# Patient Record
Sex: Female | Born: 1970 | Race: Black or African American | Hispanic: No | Marital: Married | Smoking: Never smoker
Health system: Southern US, Community
[De-identification: ages and names within clinical notes are randomized; demographics above are authoritative.]

## PROBLEM LIST (undated history)

## (undated) DIAGNOSIS — I10 Essential (primary) hypertension: Secondary | ICD-10-CM

## (undated) DIAGNOSIS — E119 Type 2 diabetes mellitus without complications: Secondary | ICD-10-CM

## (undated) DIAGNOSIS — G4730 Sleep apnea, unspecified: Secondary | ICD-10-CM

## (undated) DIAGNOSIS — C801 Malignant (primary) neoplasm, unspecified: Secondary | ICD-10-CM

## (undated) DIAGNOSIS — D649 Anemia, unspecified: Secondary | ICD-10-CM

---

## 1989-10-02 HISTORY — PX: OTHER SURGICAL HISTORY: SHX169

## 1991-10-03 HISTORY — PX: TUBAL LIGATION: SHX77

## 1992-10-02 HISTORY — PX: OTHER SURGICAL HISTORY: SHX169

## 2007-06-27 ENCOUNTER — Ambulatory Visit: Payer: Self-pay | Admitting: Cardiology

## 2009-09-02 ENCOUNTER — Ambulatory Visit (HOSPITAL_COMMUNITY): Admission: RE | Admit: 2009-09-02 | Discharge: 2009-09-02 | Payer: Self-pay | Admitting: Family Medicine

## 2009-10-02 HISTORY — PX: BREAST SURGERY: SHX581

## 2009-10-13 ENCOUNTER — Ambulatory Visit (HOSPITAL_COMMUNITY): Admission: RE | Admit: 2009-10-13 | Discharge: 2009-10-13 | Payer: Self-pay | Admitting: Family Medicine

## 2009-10-18 ENCOUNTER — Encounter: Admission: RE | Admit: 2009-10-18 | Discharge: 2009-10-18 | Payer: Self-pay | Admitting: Family Medicine

## 2009-10-20 ENCOUNTER — Encounter: Admission: RE | Admit: 2009-10-20 | Discharge: 2009-10-20 | Payer: Self-pay | Admitting: Family Medicine

## 2009-10-25 ENCOUNTER — Encounter: Admission: RE | Admit: 2009-10-25 | Discharge: 2009-10-25 | Payer: Self-pay | Admitting: Family Medicine

## 2009-11-03 ENCOUNTER — Ambulatory Visit (HOSPITAL_COMMUNITY): Admission: RE | Admit: 2009-11-03 | Discharge: 2009-11-03 | Payer: Self-pay | Admitting: Hematology and Oncology

## 2009-11-04 ENCOUNTER — Ambulatory Visit: Payer: Self-pay | Admitting: Cardiology

## 2009-11-08 ENCOUNTER — Ambulatory Visit (HOSPITAL_COMMUNITY): Admission: RE | Admit: 2009-11-08 | Discharge: 2009-11-08 | Payer: Self-pay | Admitting: Hematology and Oncology

## 2010-03-11 ENCOUNTER — Encounter: Admission: RE | Admit: 2010-03-11 | Discharge: 2010-03-11 | Payer: Self-pay | Admitting: Surgery

## 2010-03-24 ENCOUNTER — Ambulatory Visit (HOSPITAL_COMMUNITY): Admission: RE | Admit: 2010-03-24 | Discharge: 2010-03-25 | Payer: Self-pay | Admitting: Surgery

## 2010-03-24 ENCOUNTER — Encounter (INDEPENDENT_AMBULATORY_CARE_PROVIDER_SITE_OTHER): Payer: Self-pay | Admitting: Surgery

## 2010-04-21 ENCOUNTER — Ambulatory Visit: Admission: RE | Admit: 2010-04-21 | Discharge: 2010-06-22 | Payer: Self-pay | Admitting: Radiation Oncology

## 2010-04-26 IMAGING — CT NM PET TUM IMG INITIAL (PI) SKULL BASE T - THIGH
6 series · 25 of 25 positions shown · IV contrast (350 OM)
Comparison: MRI breast 10/25/2009

CLINICAL DATA: Initial treatment strategy for breast carcinoma on
the right.  High-grade ductal carcinoma insitu with the necrosis
on biopsy of the right breast.  Right axillary lymph node positive
for metastatic ductal carcinoma.

NUCLEAR MEDICINE PET SKULL BASE TO THIGH
Fasting Blood Glucose:  261
TECHNIQUE: 19.7 mCi F-18 FDG was injected intravenously via the
right arm.  Full-ring PET imaging was performed from the skull base
through the mid-thighs 60  minutes after injection.  CT data was
obtained and used for attenuation correction and anatomic
localization only.  (This was not acquired as a diagnostic CT
examination.)

[Series 1: pet ac · axial · 3.3mm · 4.69mm/px · z∈[-870,+0]mm · 6 of 267 slices shown]
[im 1/267]
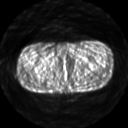
[im 54/267]
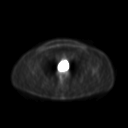
[im 107/267]
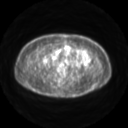
[im 160/267]
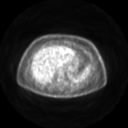
[im 213/267]
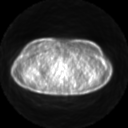
[im 267/267]
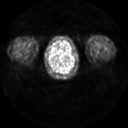

[Series 2: pet nac · axial · 3.3mm · 4.69mm/px · z∈[-870,+0]mm · 6 of 267 slices shown]
[im 1/267]
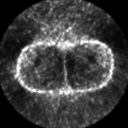
[im 54/267]
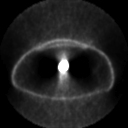
[im 107/267]
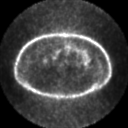
[im 160/267]
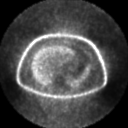
[im 213/267]
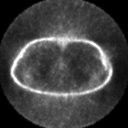
[im 267/267]
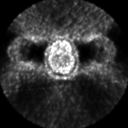

[Series 2: ct images · axial · 3.8mm · 0.98mm/px · z∈[-801,+0]mm · 5 of 238 slices shown]
[im 1/238]
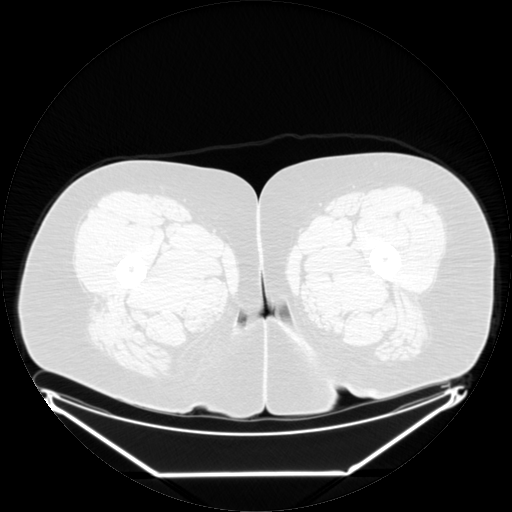
[im 60/238]
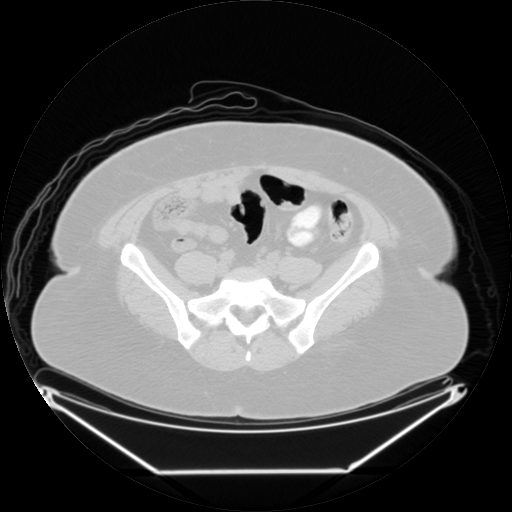
[im 119/238]
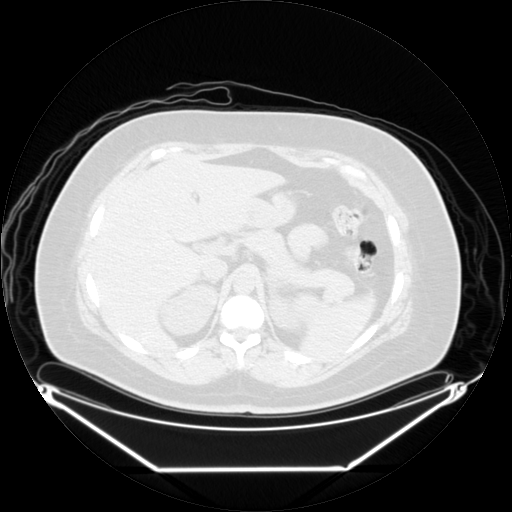
[im 178/238]
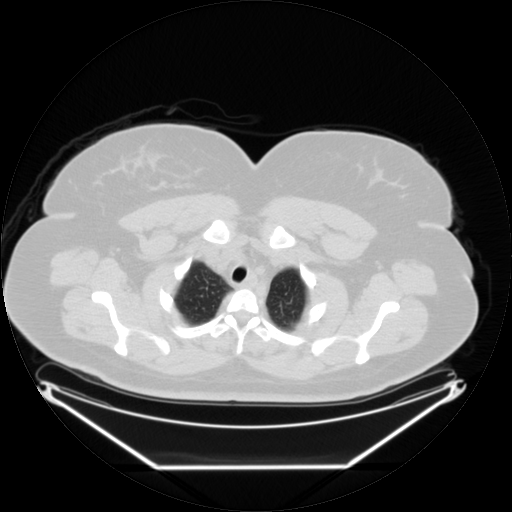
[im 238/238  brain]
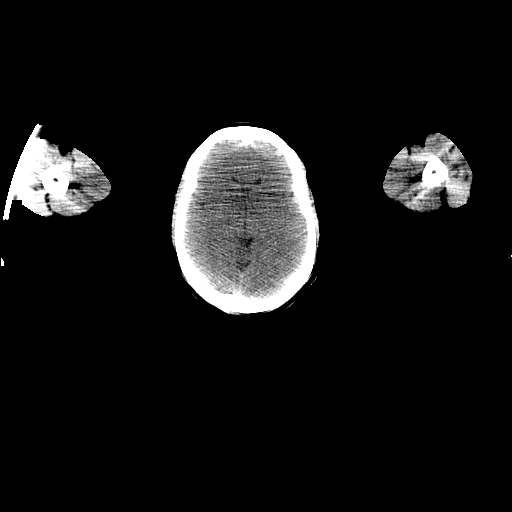

[Series 123: mip · coronal · 3.3mm · 4.69mm/px · 1 of 30 slices shown]
[im 1/30]
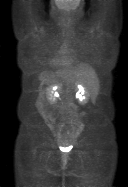

[Series 151: reformatted · axial · 3.3mm · 3.91mm/px · z∈[-870,+0]mm · 6 of 265 slices shown (1 of 2)]
[im 1/265]
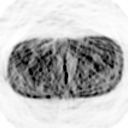
[im 53/265]
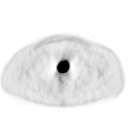
[im 106/265]
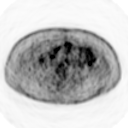
[im 159/265]
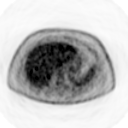
[im 212/265]
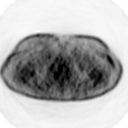
[im 265/265]
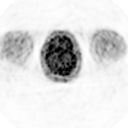

[Series 153: reformatted · coronal · 4.7mm · 6.98mm/px · 1 of 68 slices shown (2 of 2)]
[im 1/68]
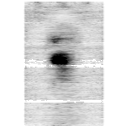

[25 of 25 positions shown; findings below may reference images not displayed]

FINDINGS: Of note, the patient's blood sugar was elevated.  The
patient reports that her blood sugars often are at this level.
Elevated blood sugar can reduces sensitivity of FDG  imaging.
Normal activity is noted within the liver and soft tissue,
therefore this is felt to represent a diagnostic scan.

Neck:No hypermetabolic nodes in the neck.

Chest:There is very mild hypermetabolic activity associated with
the small lesion in the vicinity of the right breast clip ( SUV max
= 2.6, image 64).  Likewise, there is mild metabolic activity
associated with the enlarged right axillary lymph node which
measures 17 mm short axis (image 70) with SUV max = 1.9.  No
additional hypermetabolic or enlarged axillary, supraclavicular,
infraclavicular, or internal mammary nodes.  No suspicious
pulmonary nodules.

Abdomen / Pelvis:No abnormal hypermetabolic activity within the
solid organs.  No evidence of abdominal or pelvic hypermetabolic
nodes.

Skeleton:No focal hypermetabolic activity to suggest skeletal
metastasis.
IMPRESSION: 1.  Mild metabolic activity within the right breast lesion and
right axilla lymph nodes consistent with metastasis.
2.  No evidence of distant nodal metastasis or metastasis in chest,
abdomen, or pelvis.

## 2010-05-01 IMAGING — XA IR US GUIDE VASC ACCESS LEFT
1 series · 1 of 1 positions shown · non-contrast
Comparison: none

Clinical Data/Indication: Breast cancer

TUNNEL POWER PORT PLACEMENT WITH SUBCUTANEOUS POCKET UTILIZING
ULTRASOUND & FLOUROSCOPY
Sedation: Versed six mg, Fentanyl 100 mcg.
Total Moderate Sedation Time: 45 minutes.
Contrast Volume: None.
Additional Medications: .
Fluoroscopy Time: 2.8 minutes.
Procedure:  After written informed consent was obtained, patient
was placed in the supine position on angiographic table. The right
neck and chest was prepped and draped in a sterile fashion.
Lidocaine was utilized for local anesthesia.  The left internal
jugular vein vein was noted to be patent initially with ultrasound.
Under sonographic guidance, a micropuncture needle was inserted
into the left IJ vein (Ultrasound and fluoroscopic image
documentation was performed). The needle was removed over an 018
wire which was exchanged for a Amplatz.  This was advanced into the
IVC.  An 8-French dilator was advanced over the Amplatz.
A small incision was made in the left upper chest over the anterior
left second rib.  Utilizing blunt dissection, a subcutaneous pocket
was created in the caudal direction. The pocket was irrigated with
a copious amount of sterile normal saline.  The port catheter was
tunneled from the chest incision, and out the neck incision.  The
reservoir was inserted into the subcutaneous pocket and secured
with two 3-0 Ethilon stitches.  A peel-away sheath was advanced
over the Amplatz wire.  The port catheter was cut to measure length
and inserted through the peel-away sheath.  The peel-away sheath
was removed.  The chest incision was closed with 3-0 Vicryl
interrupted stitches for the subcutaneous tissue and a running of 4-
0 Vicryl subcuticular stitch for the skin.  The neck incision was
closed with a 4-0 Vicryl subcuticular stitch.  Derma-bond was
applied to both surgical incisions.  The port reservoir was flushed
and instilled with heparinized saline.  No complications.

[Series 1: fl  angio · 1 of 1 slices shown]
[im 1/1]
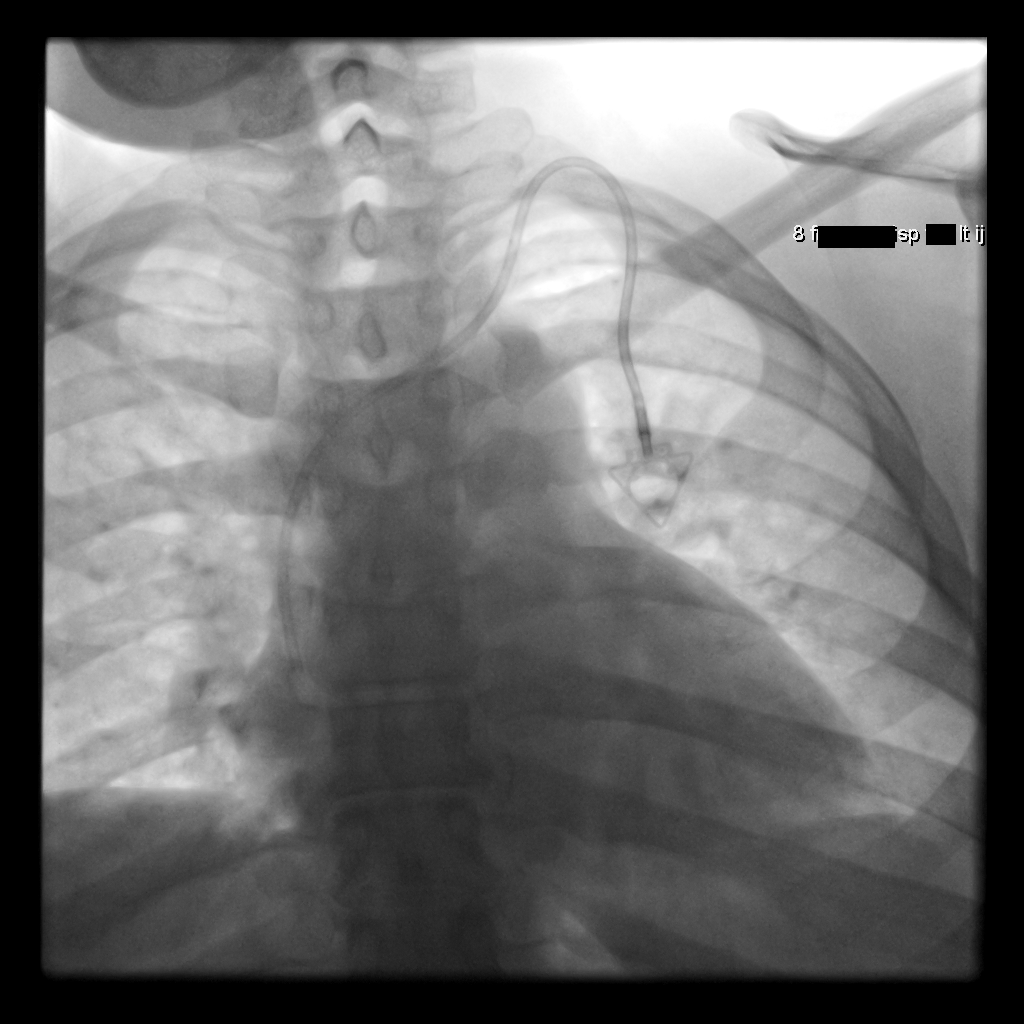

[1 of 1 positions shown; findings below may reference images not displayed]

FINDINGS: A left IJ vein Port-A-Cath is in place with its tip at
the cavoatrial junction.
IMPRESSION: Successful 8 French left internal jugular vein power port placement
with its tip at the SVC/RA junction.

## 2010-08-16 ENCOUNTER — Ambulatory Visit: Payer: Self-pay | Admitting: Gastroenterology

## 2010-08-16 DIAGNOSIS — R197 Diarrhea, unspecified: Secondary | ICD-10-CM

## 2010-09-19 ENCOUNTER — Ambulatory Visit (HOSPITAL_COMMUNITY): Admission: RE | Admit: 2010-09-19 | Payer: Self-pay | Source: Home / Self Care | Admitting: Hematology and Oncology

## 2010-10-21 ENCOUNTER — Other Ambulatory Visit (HOSPITAL_COMMUNITY): Payer: Self-pay | Admitting: Hematology and Oncology

## 2010-10-21 DIAGNOSIS — C50919 Malignant neoplasm of unspecified site of unspecified female breast: Secondary | ICD-10-CM

## 2010-10-23 ENCOUNTER — Encounter: Payer: Self-pay | Admitting: Surgery

## 2010-10-23 ENCOUNTER — Encounter: Payer: Self-pay | Admitting: Hematology and Oncology

## 2010-11-01 NOTE — Letter (Signed)
Summary: TCS ORDER  TCS ORDER   Imported By: Ave Filter 08/16/2010 09:47:57  _____________________________________________________________________  External Attachment:    Type:   Image     Comment:   External Document  Appended Document: TCS ORDER Pt called and lm she cant afford her prep so she wanted to cx her procedure for tomorrow and she will call back later to reschedule. I called pt to offer her a sample,but I was unable to reach her,lmom.
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
## 2010-12-18 LAB — BASIC METABOLIC PANEL
CO2: 27 mEq/L (ref 19–32)
Calcium: 8.9 mg/dL (ref 8.4–10.5)
Chloride: 104 mEq/L (ref 96–112)
Glucose, Bld: 313 mg/dL — ABNORMAL HIGH (ref 70–99)
Sodium: 138 mEq/L (ref 135–145)

## 2010-12-18 LAB — CBC
HCT: 29.2 % — ABNORMAL LOW (ref 36.0–46.0)
Hemoglobin: 9.8 g/dL — ABNORMAL LOW (ref 12.0–15.0)
MCHC: 33.6 g/dL (ref 30.0–36.0)
MCV: 83.7 fL (ref 78.0–100.0)
Platelets: 287 10*3/uL (ref 150–400)
RDW: 15.1 % (ref 11.5–15.5)

## 2010-12-18 LAB — GLUCOSE, CAPILLARY: Glucose-Capillary: 139 mg/dL — ABNORMAL HIGH (ref 70–99)

## 2010-12-18 LAB — ABO/RH: ABO/RH(D): B POS

## 2012-05-21 ENCOUNTER — Encounter: Payer: Self-pay | Admitting: Hematology and Oncology

## 2012-05-21 DIAGNOSIS — C50919 Malignant neoplasm of unspecified site of unspecified female breast: Secondary | ICD-10-CM

## 2012-05-21 DIAGNOSIS — Z171 Estrogen receptor negative status [ER-]: Secondary | ICD-10-CM

## 2012-09-10 DIAGNOSIS — C50919 Malignant neoplasm of unspecified site of unspecified female breast: Secondary | ICD-10-CM

## 2012-09-10 DIAGNOSIS — Z452 Encounter for adjustment and management of vascular access device: Secondary | ICD-10-CM

## 2012-11-22 DIAGNOSIS — C50919 Malignant neoplasm of unspecified site of unspecified female breast: Secondary | ICD-10-CM

## 2012-11-22 DIAGNOSIS — Z171 Estrogen receptor negative status [ER-]: Secondary | ICD-10-CM

## 2013-05-05 ENCOUNTER — Encounter (INDEPENDENT_AMBULATORY_CARE_PROVIDER_SITE_OTHER): Payer: BC Managed Care – PPO

## 2013-05-05 DIAGNOSIS — Z9010 Acquired absence of unspecified breast and nipple: Secondary | ICD-10-CM

## 2013-05-05 DIAGNOSIS — Z452 Encounter for adjustment and management of vascular access device: Secondary | ICD-10-CM

## 2013-05-05 DIAGNOSIS — D509 Iron deficiency anemia, unspecified: Secondary | ICD-10-CM

## 2013-05-05 DIAGNOSIS — Z171 Estrogen receptor negative status [ER-]: Secondary | ICD-10-CM

## 2013-05-05 DIAGNOSIS — C50919 Malignant neoplasm of unspecified site of unspecified female breast: Secondary | ICD-10-CM

## 2013-05-06 ENCOUNTER — Encounter (HOSPITAL_COMMUNITY): Payer: Self-pay | Admitting: Pharmacy Technician

## 2013-05-07 ENCOUNTER — Ambulatory Visit (HOSPITAL_COMMUNITY)
Admission: RE | Admit: 2013-05-07 | Discharge: 2013-05-07 | Disposition: A | Discharge status: Home / Self Care | Payer: BC Managed Care – PPO | Source: Ambulatory Visit | Attending: Anesthesiology | Admitting: Anesthesiology

## 2013-05-07 ENCOUNTER — Encounter (HOSPITAL_COMMUNITY)
Admission: RE | Admit: 2013-05-07 | Discharge: 2013-05-07 | Disposition: A | Discharge status: Home / Self Care | Payer: BC Managed Care – PPO | Source: Ambulatory Visit | Attending: Plastic Surgery | Admitting: Plastic Surgery

## 2013-05-07 ENCOUNTER — Encounter (HOSPITAL_COMMUNITY): Payer: Self-pay

## 2013-05-07 DIAGNOSIS — Z01812 Encounter for preprocedural laboratory examination: Secondary | ICD-10-CM | POA: Diagnosis not present

## 2013-05-07 DIAGNOSIS — Z01818 Encounter for other preprocedural examination: Secondary | ICD-10-CM | POA: Insufficient documentation

## 2013-05-07 DIAGNOSIS — Z01810 Encounter for preprocedural cardiovascular examination: Secondary | ICD-10-CM | POA: Insufficient documentation

## 2013-05-07 HISTORY — DX: Anemia, unspecified: D64.9

## 2013-05-07 HISTORY — DX: Malignant (primary) neoplasm, unspecified: C80.1

## 2013-05-07 HISTORY — DX: Type 2 diabetes mellitus without complications: E11.9

## 2013-05-07 HISTORY — DX: Sleep apnea, unspecified: G47.30

## 2013-05-07 LAB — BASIC METABOLIC PANEL
BUN: 8 mg/dL (ref 6–23)
Calcium: 9.3 mg/dL (ref 8.4–10.5)
GFR calc Af Amer: >90 mL/min (ref >90)
GFR calc non Af Amer: 85 mL/min — ABNORMAL LOW (ref >90)
Glucose, Bld: 179 mg/dL — ABNORMAL HIGH (ref 70–99)
Potassium: 3.8 mEq/L (ref 3.5–5.1)

## 2013-05-07 LAB — CBC
Hemoglobin: 11.2 g/dL — ABNORMAL LOW (ref 12.0–15.0)
MCH: 26 pg (ref 26.0–34.0)
MCHC: 32.9 g/dL (ref 30.0–36.0)
Platelets: 270 10*3/uL (ref 150–400)
RDW: 12.8 % (ref 11.5–15.5)

## 2013-05-07 NOTE — Progress Notes (Addendum)
No orders at preadmit. Office called by cheri sec. Sleep study req'd [facility].

## 2013-05-07 NOTE — Pre-Procedure Instructions (Addendum)
LUN MURO  05/07/2013   Your procedure is scheduled on:  05/15/13  Report to [facility] at 700 AM.  Call this number if you have problems the morning of surgery: [phone]   Remember:   Do not eat food or drink liquids after midnight.   Take these medicines the morning of surgery with A SIP OF WATER: none  NO DIABETIC MED AM OF SURGERY   Do not wear jewelry, make-up or nail polish.  Do not wear lotions, powders, or perfumes. You may wear deodorant.  Do not shave 48 hours prior to surgery. Men may shave face and neck.  Do not bring valuables to the hospital.  [facility] is not responsible                   for any belongings or valuables.  Contacts, dentures or bridgework may not be worn into surgery.  Leave suitcase in the car. After surgery it may be brought to your room.  For patients admitted to the hospital, checkout time is 11:00 AM the day of  discharge.   Patients discharged the day of surgery will not be allowed to drive  home.  Name and phone number of your driver:  Special Instructions: Shower using CHG 2 nights before surgery and the night before surgery.  If you shower the day of surgery use CHG.  Use special wash - you have one bottle of CHG for all showers.  You should use approximately 1/3 of the bottle for each shower.   Please read over the following fact sheets that you were given: Pain Booklet, Coughing and Deep Breathing and Surgical Site Infection Prevention

## 2013-05-08 ENCOUNTER — Other Ambulatory Visit: Payer: Self-pay | Admitting: Plastic Surgery

## 2013-05-08 DIAGNOSIS — Z9013 Acquired absence of bilateral breasts and nipples: Secondary | ICD-10-CM

## 2013-05-08 NOTE — H&P (Signed)
This document contains confidential information from a [facility] medical record system and may be unauthenticated. Release may be made only with a valid authorization or in accordance with applicable policies of Medical Center or its affiliates. This document must be maintained in a secure manner or discarded/destroyed as required by Medical Center policy or by a confidential means such as shredding.   Rita Austin  04/29/2013 2:00 PM   Office Visit  MRN:  [id number]  Department:  Plastic Surgery  Dept Phone: [phone]      Diagnoses -  Breast cancer, female, right (HCC)    -  Primary   174.9    Acquired absence of bilateral breasts and nipples       V45.71     Vitals - Last Recorded   128/84  86  1.651 m (5' 5")  115.667 kg (255 lb)  42.43 kg/m2       Subjective:    Patient ID: Rita Austin is a 42 y.o. female.  HPI  The patient is a 42 yrs old bf here with her mom and husband for a history and physical  for bilateral breast reconstruction.   History: She was diagnosed with right breast cancer in 2011 and underwent chemo followed by bilateral mastectomies and right breast radiation for treatment. She decided to pursue breast reconstruction. She is 5 feet 5 inches tall and 250 pounds. She wore a 44 DD prior to surgery. She has been able to decrease her HgA1C to 6.9. She is otherwise in good health and not had any recent illnesses. There is no significant change in her exam.  The following portions of the patient's history were reviewed and updated as appropriate: allergies, current medications, past family history, past medical history, past social history, past surgical history and problem list.  Review of Systems  All other systems reviewed and are negative.     Objective:    Physical Exam  Constitutional: She is oriented to person, place, and time. She appears well-developed and well-nourished. No distress.  HENT:   Head: Normocephalic and atraumatic.   Nose: Nose  normal.   Mouth/Throat: Oropharynx is clear and moist.  Neck: Normal range of motion. Neck supple. No tracheal deviation present. No thyromegaly present.  Cardiovascular: Normal rate, regular rhythm and intact distal pulses.  Exam reveals no gallop and no friction rub.    No murmur heard. Pulmonary/Chest: Effort normal. No stridor. No respiratory distress. She has no rales. She exhibits no tenderness.  Abdominal: Soft. Bowel sounds are normal. She exhibits no distension and no mass. There is no tenderness.  Musculoskeletal: Normal range of motion.  Neurological: She is alert and oriented to person, place, and time.  Skin: Skin is warm and dry.  Psychiatric: She has a normal mood and affect. Her behavior is normal. Judgment and thought content normal.      Assessment:   1.  Breast cancer, female, right (HCC)    2.  Acquired absence of bilateral breasts and nipples        Plan:   A right latissimus breast reconstruction with expander placement and left expander/FlexHd placement.   The procedure, possible risks, benefits and complications were discussed with the patient and she desires to proceed and consent was obtained.   Medications Ordered This Encounter     cephalexin (KEFLEX) 500 MG capsule Take 1 capsule (500 mg total) by mouth 4 times daily.    HYDROcodone-acetaminophen (NORCO) 5-325 mg per tablet Take 1 tablet by   mouth every 6 (six) hours as needed for up to 10 days for Pain.    promethazine (PHENERGAN) 12.5 MG tablet  Take 1 tablet (12.5 mg total) by mouth every 6 (six) hours as needed for up to 7 days for Nausea.     diazepam (VALIUM) 2 MG tablet 30 Take 1 tablet (2 mg total) by mouth every 6 (six) hours as needed for up to 10 days for Anxiety.        

## 2013-05-14 MED ORDER — CEFAZOLIN SODIUM-DEXTROSE 2-3 GM-% IV SOLR
2.0000 g | INTRAVENOUS | Status: AC
  Administered 2013-05-15 (×2): 2 g via INTRAVENOUS
  Filled 2013-05-14: qty 50

## 2013-05-15 ENCOUNTER — Inpatient Hospital Stay (HOSPITAL_COMMUNITY)
Admission: RE | Admit: 2013-05-15 | Discharge: 2013-05-19 | DRG: 461 | Disposition: A | Discharge status: Home / Self Care | Payer: BC Managed Care – PPO | Source: Ambulatory Visit | Attending: Plastic Surgery | Admitting: Plastic Surgery

## 2013-05-15 ENCOUNTER — Encounter (HOSPITAL_COMMUNITY): Payer: Self-pay | Admitting: Plastic Surgery

## 2013-05-15 ENCOUNTER — Encounter (HOSPITAL_COMMUNITY): Payer: Self-pay | Admitting: Anesthesiology

## 2013-05-15 ENCOUNTER — Inpatient Hospital Stay (HOSPITAL_COMMUNITY): Payer: BC Managed Care – PPO | Admitting: Anesthesiology

## 2013-05-15 ENCOUNTER — Encounter (HOSPITAL_COMMUNITY)
Admission: RE | Disposition: A | Discharge status: Home / Self Care | Payer: Self-pay | Source: Ambulatory Visit | Attending: Plastic Surgery

## 2013-05-15 DIAGNOSIS — M62838 Other muscle spasm: Secondary | ICD-10-CM | POA: Diagnosis not present

## 2013-05-15 DIAGNOSIS — C50919 Malignant neoplasm of unspecified site of unspecified female breast: Secondary | ICD-10-CM | POA: Diagnosis present

## 2013-05-15 DIAGNOSIS — Z9010 Acquired absence of unspecified breast and nipple: Principal | ICD-10-CM

## 2013-05-15 DIAGNOSIS — Z9013 Acquired absence of bilateral breasts and nipples: Secondary | ICD-10-CM

## 2013-05-15 HISTORY — PX: LATISSIMUS FLAP TO BREAST: SHX5357

## 2013-05-15 HISTORY — PX: TISSUE EXPANDER PLACEMENT: SHX2530

## 2013-05-15 LAB — BASIC METABOLIC PANEL
BUN: 8 mg/dL (ref 6–23)
Chloride: 100 mEq/L (ref 96–112)
GFR calc Af Amer: >90 mL/min (ref >90)
GFR calc non Af Amer: >90 mL/min (ref >90)
Glucose, Bld: 181 mg/dL — ABNORMAL HIGH (ref 70–99)
Potassium: 4 mEq/L (ref 3.5–5.1)
Sodium: 134 mEq/L — ABNORMAL LOW (ref 135–145)

## 2013-05-15 LAB — CBC
HCT: 30 % — ABNORMAL LOW (ref 36.0–46.0)
Hemoglobin: 9.7 g/dL — ABNORMAL LOW (ref 12.0–15.0)
MCHC: 32.3 g/dL (ref 30.0–36.0)
RBC: 3.8 MIL/uL — ABNORMAL LOW (ref 3.87–5.11)

## 2013-05-15 LAB — GLUCOSE, CAPILLARY
Glucose-Capillary: 121 mg/dL — ABNORMAL HIGH (ref 70–99)
Glucose-Capillary: 121 mg/dL — ABNORMAL HIGH (ref 70–99)
Glucose-Capillary: 176 mg/dL — ABNORMAL HIGH (ref 70–99)

## 2013-05-15 SURGERY — RECONSTRUCTION, BREAST, USING LATISSIMUS DORSI MYOCUTANEOUS FLAP
Anesthesia: General | Site: Breast | Laterality: Right | Wound class: Clean

## 2013-05-15 MED ORDER — FENTANYL CITRATE 0.05 MG/ML IJ SOLN
INTRAMUSCULAR | Status: DC | PRN
  Administered 2013-05-15: 100 ug via INTRAVENOUS
  Administered 2013-05-15 (×8): 50 ug via INTRAVENOUS

## 2013-05-15 MED ORDER — MORPHINE SULFATE (PF) 1 MG/ML IV SOLN
INTRAVENOUS | Status: DC
Start: 2013-05-15 — End: 2013-05-17
  Administered 2013-05-15: 23.08 mg via INTRAVENOUS
  Administered 2013-05-16 (×3): via INTRAVENOUS
  Administered 2013-05-16: 15 mg via INTRAVENOUS
  Administered 2013-05-16: 09:00:00 via INTRAVENOUS
  Administered 2013-05-16: 21 mg via INTRAVENOUS
  Administered 2013-05-16: 16.5 mg via INTRAVENOUS
  Administered 2013-05-17: 1.5 mg via INTRAVENOUS
  Administered 2013-05-17: 1.43 mg via INTRAVENOUS
  Administered 2013-05-17: 3 mg via INTRAVENOUS
  Filled 2013-05-15 (×6): qty 25

## 2013-05-15 MED ORDER — KCL IN DEXTROSE-NACL 20-5-0.45 MEQ/L-%-% IV SOLN
INTRAVENOUS | Status: AC
  Filled 2013-05-15: qty 1000

## 2013-05-15 MED ORDER — DOCUSATE SODIUM 100 MG PO CAPS
100.0000 mg | ORAL_CAPSULE | Freq: Two times a day (BID) | ORAL | Status: DC
  Administered 2013-05-15 – 2013-05-19 (×8): 100 mg via ORAL
  Filled 2013-05-15 (×9): qty 1

## 2013-05-15 MED ORDER — HYDROMORPHONE HCL PF 1 MG/ML IJ SOLN
0.2500 mg | INTRAMUSCULAR | Status: DC | PRN
  Administered 2013-05-15 (×2): 0.5 mg via INTRAVENOUS

## 2013-05-15 MED ORDER — INSULIN LISPRO 100 UNIT/ML ~~LOC~~ SOLN: 10.0000 [IU] | Freq: Two times a day (BID) | SUBCUTANEOUS | Status: DC

## 2013-05-15 MED ORDER — FLEET ENEMA 7-19 GM/118ML RE ENEM
1.0000 | ENEMA | Freq: Once | RECTAL | Status: AC | PRN
  Filled 2013-05-15: qty 1

## 2013-05-15 MED ORDER — NALOXONE HCL 0.4 MG/ML IJ SOLN
0.4000 mg | INTRAMUSCULAR | Status: DC | PRN
  Filled 2013-05-15: qty 1

## 2013-05-15 MED ORDER — ONDANSETRON HCL 4 MG/2ML IJ SOLN
4.0000 mg | Freq: Four times a day (QID) | INTRAMUSCULAR | Status: DC | PRN
  Administered 2013-05-15 – 2013-05-16 (×3): 4 mg via INTRAVENOUS
  Filled 2013-05-15 (×4): qty 2

## 2013-05-15 MED ORDER — EVICEL 5 ML EX KIT
PACK | CUTANEOUS | Status: AC
  Filled 2013-05-15: qty 1

## 2013-05-15 MED ORDER — ARTIFICIAL TEARS OP OINT
TOPICAL_OINTMENT | OPHTHALMIC | Status: DC | PRN
  Administered 2013-05-15: 1 via OPHTHALMIC

## 2013-05-15 MED ORDER — BISACODYL 5 MG PO TBEC
5.0000 mg | DELAYED_RELEASE_TABLET | Freq: Every day | ORAL | Status: DC | PRN
  Administered 2013-05-18: 5 mg via ORAL
  Filled 2013-05-15: qty 1

## 2013-05-15 MED ORDER — ONDANSETRON HCL 4 MG/2ML IJ SOLN
INTRAMUSCULAR | Status: DC | PRN
  Administered 2013-05-15 (×2): 4 mg via INTRAVENOUS

## 2013-05-15 MED ORDER — SODIUM CHLORIDE 0.9 % IJ SOLN: 9.0000 mL | INTRAMUSCULAR | Status: DC | PRN

## 2013-05-15 MED ORDER — SODIUM CHLORIDE 0.9 % IV SOLN
3.0000 g | Freq: Four times a day (QID) | INTRAVENOUS | Status: DC
  Administered 2013-05-15 – 2013-05-18 (×11): 3 g via INTRAVENOUS
  Filled 2013-05-15 (×16): qty 3

## 2013-05-15 MED ORDER — INSULIN ASPART 100 UNIT/ML ~~LOC~~ SOLN
10.0000 [IU] | Freq: Two times a day (BID) | SUBCUTANEOUS | Status: DC
  Administered 2013-05-16 – 2013-05-19 (×7): 10 [IU] via SUBCUTANEOUS

## 2013-05-15 MED ORDER — MIDAZOLAM HCL 5 MG/5ML IJ SOLN
INTRAMUSCULAR | Status: DC | PRN
  Administered 2013-05-15: 2 mg via INTRAVENOUS

## 2013-05-15 MED ORDER — MIDAZOLAM HCL 2 MG/2ML IJ SOLN: 1.0000 mg | INTRAMUSCULAR | Status: DC | PRN

## 2013-05-15 MED ORDER — DIPHENHYDRAMINE HCL 12.5 MG/5ML PO ELIX
12.5000 mg | ORAL_SOLUTION | Freq: Four times a day (QID) | ORAL | Status: DC | PRN
  Filled 2013-05-15: qty 5

## 2013-05-15 MED ORDER — PROMETHAZINE HCL 25 MG/ML IJ SOLN
INTRAMUSCULAR | Status: AC
  Administered 2013-05-15: 6.25 mg via INTRAVENOUS
  Filled 2013-05-15: qty 1

## 2013-05-15 MED ORDER — SIMVASTATIN 20 MG PO TABS
20.0000 mg | ORAL_TABLET | Freq: Every morning | ORAL | Status: DC
  Administered 2013-05-16 – 2013-05-19 (×4): 20 mg via ORAL
  Filled 2013-05-15 (×4): qty 1

## 2013-05-15 MED ORDER — PROPOFOL 10 MG/ML IV BOLUS
INTRAVENOUS | Status: DC | PRN
  Administered 2013-05-15: 200 mg via INTRAVENOUS

## 2013-05-15 MED ORDER — BUPIVACAINE HCL (PF) 0.25 % IJ SOLN
INTRAMUSCULAR | Status: AC
  Filled 2013-05-15: qty 30

## 2013-05-15 MED ORDER — ACETAMINOPHEN 650 MG RE SUPP: 650.0000 mg | Freq: Four times a day (QID) | RECTAL | Status: DC | PRN

## 2013-05-15 MED ORDER — HYDROMORPHONE HCL PF 1 MG/ML IJ SOLN
INTRAMUSCULAR | Status: AC
  Filled 2013-05-15: qty 1

## 2013-05-15 MED ORDER — SODIUM CHLORIDE 0.9 % IJ SOLN
10.0000 mL | INTRAMUSCULAR | Status: DC | PRN
  Administered 2013-05-18: 20 mL
  Administered 2013-05-18 – 2013-05-19 (×2): 10 mL

## 2013-05-15 MED ORDER — LIDOCAINE HCL (CARDIAC) 20 MG/ML IV SOLN
INTRAVENOUS | Status: DC | PRN
  Administered 2013-05-15: 100 mg via INTRAVENOUS

## 2013-05-15 MED ORDER — DIPHENHYDRAMINE HCL 50 MG/ML IJ SOLN
12.5000 mg | Freq: Four times a day (QID) | INTRAMUSCULAR | Status: DC | PRN
  Filled 2013-05-15: qty 0.25

## 2013-05-15 MED ORDER — DIAZEPAM 2 MG PO TABS
2.0000 mg | ORAL_TABLET | Freq: Two times a day (BID) | ORAL | Status: DC | PRN
  Administered 2013-05-18 – 2013-05-19 (×3): 2 mg via ORAL
  Filled 2013-05-15 (×3): qty 1

## 2013-05-15 MED ORDER — FENTANYL CITRATE 0.05 MG/ML IJ SOLN: 50.0000 ug | Freq: Once | INTRAMUSCULAR | Status: DC

## 2013-05-15 MED ORDER — LOSARTAN POTASSIUM 50 MG PO TABS
50.0000 mg | ORAL_TABLET | Freq: Every morning | ORAL | Status: DC
  Administered 2013-05-16 – 2013-05-19 (×4): 50 mg via ORAL
  Filled 2013-05-15 (×4): qty 1

## 2013-05-15 MED ORDER — HYDROMORPHONE HCL PF 1 MG/ML IJ SOLN
INTRAMUSCULAR | Status: AC
  Administered 2013-05-15: 0.5 mg via INTRAVENOUS
  Filled 2013-05-15: qty 1

## 2013-05-15 MED ORDER — OXYCODONE HCL 5 MG PO TABS
ORAL_TABLET | ORAL | Status: AC
  Filled 2013-05-15: qty 1

## 2013-05-15 MED ORDER — GLYCOPYRROLATE 0.2 MG/ML IJ SOLN
INTRAMUSCULAR | Status: DC | PRN
  Administered 2013-05-15: .8 mg via INTRAVENOUS

## 2013-05-15 MED ORDER — ONDANSETRON HCL 4 MG/2ML IJ SOLN: 4.0000 mg | Freq: Four times a day (QID) | INTRAMUSCULAR | Status: DC | PRN

## 2013-05-15 MED ORDER — METFORMIN HCL 500 MG PO TABS: 1000.0000 mg | ORAL_TABLET | Freq: Two times a day (BID) | ORAL | Status: DC

## 2013-05-15 MED ORDER — OXYCODONE HCL 5 MG PO TABS
5.0000 mg | ORAL_TABLET | Freq: Once | ORAL | Status: AC | PRN
  Administered 2013-05-15: 5 mg via ORAL

## 2013-05-15 MED ORDER — MORPHINE SULFATE (PF) 1 MG/ML IV SOLN
INTRAVENOUS | Status: AC
  Filled 2013-05-15: qty 25

## 2013-05-15 MED ORDER — KCL IN DEXTROSE-NACL 20-5-0.45 MEQ/L-%-% IV SOLN
INTRAVENOUS | Status: DC
  Administered 2013-05-15 – 2013-05-17 (×5): via INTRAVENOUS
  Filled 2013-05-15 (×10): qty 1000

## 2013-05-15 MED ORDER — PHENYLEPHRINE HCL 10 MG/ML IJ SOLN
INTRAMUSCULAR | Status: DC | PRN
  Administered 2013-05-15 (×3): 40 ug via INTRAVENOUS
  Administered 2013-05-15: 80 ug via INTRAVENOUS
  Administered 2013-05-15: 40 ug via INTRAVENOUS

## 2013-05-15 MED ORDER — 0.9 % SODIUM CHLORIDE (POUR BTL) OPTIME
TOPICAL | Status: DC | PRN
  Administered 2013-05-15 (×2): 1000 mL

## 2013-05-15 MED ORDER — EVICEL 5 ML EX KIT
PACK | CUTANEOUS | Status: DC | PRN
  Administered 2013-05-15 (×2): 5 mL

## 2013-05-15 MED ORDER — PROMETHAZINE HCL 25 MG/ML IJ SOLN: 6.2500 mg | INTRAMUSCULAR | Status: DC | PRN

## 2013-05-15 MED ORDER — CEFAZOLIN SODIUM 1-5 GM-% IV SOLN
INTRAVENOUS | Status: AC
  Filled 2013-05-15: qty 100

## 2013-05-15 MED ORDER — OXYCODONE-ACETAMINOPHEN 5-325 MG PO TABS
ORAL_TABLET | ORAL | Status: AC
  Filled 2013-05-15: qty 1

## 2013-05-15 MED ORDER — METFORMIN HCL 500 MG PO TABS
1000.0000 mg | ORAL_TABLET | Freq: Two times a day (BID) | ORAL | Status: DC
  Administered 2013-05-16 – 2013-05-19 (×7): 1000 mg via ORAL
  Filled 2013-05-15 (×9): qty 2

## 2013-05-15 MED ORDER — INSULIN GLARGINE 100 UNIT/ML ~~LOC~~ SOLN
50.0000 [IU] | Freq: Every morning | SUBCUTANEOUS | Status: DC
  Administered 2013-05-16 – 2013-05-19 (×4): 50 [IU] via SUBCUTANEOUS
  Filled 2013-05-15 (×4): qty 0.5

## 2013-05-15 MED ORDER — OXYCODONE HCL 5 MG/5ML PO SOLN: 5.0000 mg | Freq: Once | ORAL | Status: AC | PRN

## 2013-05-15 MED ORDER — ROCURONIUM BROMIDE 100 MG/10ML IV SOLN
INTRAVENOUS | Status: DC | PRN
  Administered 2013-05-15: 50 mg via INTRAVENOUS
  Administered 2013-05-15 (×2): 10 mg via INTRAVENOUS

## 2013-05-15 MED ORDER — LACTATED RINGERS IV SOLN
INTRAVENOUS | Status: DC | PRN
  Administered 2013-05-15 (×5): via INTRAVENOUS

## 2013-05-15 MED ORDER — SODIUM CHLORIDE 0.9 % IJ SOLN: 10.0000 mL | Freq: Two times a day (BID) | INTRAMUSCULAR | Status: DC

## 2013-05-15 MED ORDER — NEOSTIGMINE METHYLSULFATE 1 MG/ML IJ SOLN
INTRAMUSCULAR | Status: DC | PRN
  Administered 2013-05-15: 5 mg via INTRAVENOUS

## 2013-05-15 MED ORDER — LABETALOL HCL 5 MG/ML IV SOLN
INTRAVENOUS | Status: DC | PRN
  Administered 2013-05-15: 5 mg via INTRAVENOUS

## 2013-05-15 MED ORDER — LACTATED RINGERS IV SOLN
INTRAVENOUS | Status: DC
  Administered 2013-05-15: 07:00:00 via INTRAVENOUS

## 2013-05-15 MED ORDER — SODIUM CHLORIDE 0.9 % IR SOLN
Status: DC | PRN
  Administered 2013-05-15 (×2)

## 2013-05-15 MED ORDER — ACETAMINOPHEN 325 MG PO TABS
650.0000 mg | ORAL_TABLET | Freq: Four times a day (QID) | ORAL | Status: DC | PRN
  Administered 2013-05-17: 650 mg via ORAL
  Filled 2013-05-15: qty 2

## 2013-05-15 MED ORDER — ALBUMIN HUMAN 5 % IV SOLN
INTRAVENOUS | Status: DC | PRN
  Administered 2013-05-15 (×2): via INTRAVENOUS

## 2013-05-15 SURGICAL SUPPLY — 86 items
ADH SKN CLS APL DERMABOND .7 (GAUZE/BANDAGES/DRESSINGS) ×8
APPLIER CLIP 9.375 MED OPEN (MISCELLANEOUS) ×3
APR CLP MED 9.3 20 MLT OPN (MISCELLANEOUS) ×2
BAG DECANTER FOR FLEXI CONT (MISCELLANEOUS) ×4 IMPLANT
BINDER BREAST LRG (GAUZE/BANDAGES/DRESSINGS) IMPLANT
BINDER BREAST XLRG (GAUZE/BANDAGES/DRESSINGS) ×1 IMPLANT
BIOPATCH RED 1 DISK 7.0 (GAUZE/BANDAGES/DRESSINGS) ×6 IMPLANT
BLADE SURG 10 STRL SS (BLADE) ×3 IMPLANT
BLADE SURG 15 STRL LF DISP TIS (BLADE) ×2 IMPLANT
BLADE SURG 15 STRL SS (BLADE) ×3
BNDG COHESIVE 4X5 TAN STRL (GAUZE/BANDAGES/DRESSINGS) ×1 IMPLANT
BUPIV W/ EPI, 0.25% AND 1:200,000 ×1 IMPLANT
CANISTER SUCTION 2500CC (MISCELLANEOUS) ×3 IMPLANT
CHLORAPREP W/TINT 26ML (MISCELLANEOUS) ×3 IMPLANT
CLIP APPLIE 9.375 MED OPEN (MISCELLANEOUS) ×2 IMPLANT
CLOTH BEACON ORANGE TIMEOUT ST (SAFETY) ×3 IMPLANT
COVER SURGICAL LIGHT HANDLE (MISCELLANEOUS) ×3 IMPLANT
DERMABOND ADVANCED (GAUZE/BANDAGES/DRESSINGS) ×4
DERMABOND ADVANCED .7 DNX12 (GAUZE/BANDAGES/DRESSINGS) ×2 IMPLANT
DRAIN CHANNEL 19F RND (DRAIN) ×8 IMPLANT
DRAPE INCISE 23X17 IOBAN STRL (DRAPES)
DRAPE INCISE 23X17 STRL (DRAPES) IMPLANT
DRAPE INCISE IOBAN 23X17 STRL (DRAPES) IMPLANT
DRAPE INCISE IOBAN 85X60 (DRAPES) ×1 IMPLANT
DRAPE ORTHO SPLIT 77X108 STRL (DRAPES) ×12
DRAPE PROXIMA HALF (DRAPES) ×6 IMPLANT
DRAPE SURG 17X23 STRL (DRAPES) ×8 IMPLANT
DRAPE SURG ORHT 6 SPLT 77X108 (DRAPES) ×4 IMPLANT
DRAPE WARM FLUID 44X44 (DRAPE) ×3 IMPLANT
DRSG MEPILEX BORDER 4X12 (GAUZE/BANDAGES/DRESSINGS) ×1 IMPLANT
DRSG MEPILEX BORDER 4X8 (GAUZE/BANDAGES/DRESSINGS) ×2 IMPLANT
DRSG PAD ABDOMINAL 8X10 ST (GAUZE/BANDAGES/DRESSINGS) ×6 IMPLANT
DRSG TEGADERM 4X4.75 (GAUZE/BANDAGES/DRESSINGS) ×1 IMPLANT
ELECT BLADE 4.0 EZ CLEAN MEGAD (MISCELLANEOUS) ×3
ELECT BLADE 6.5 EXT (BLADE) ×3 IMPLANT
ELECT CAUTERY BLADE 6.4 (BLADE) ×7 IMPLANT
ELECT REM PT RETURN 9FT ADLT (ELECTROSURGICAL) ×3
ELECTRODE BLDE 4.0 EZ CLN MEGD (MISCELLANEOUS) ×2 IMPLANT
ELECTRODE REM PT RTRN 9FT ADLT (ELECTROSURGICAL) ×2 IMPLANT
EVACUATOR SILICONE 100CC (DRAIN) ×8 IMPLANT
GAUZE XEROFORM 5X9 LF (GAUZE/BANDAGES/DRESSINGS) ×2 IMPLANT
GLOVE BIO SURGEON STRL SZ 6.5 (GLOVE) ×12 IMPLANT
GLOVE BIOGEL PI IND STRL 6.5 (GLOVE) IMPLANT
GLOVE BIOGEL PI IND STRL 7.0 (GLOVE) IMPLANT
GLOVE BIOGEL PI INDICATOR 6.5 (GLOVE) ×1
GLOVE BIOGEL PI INDICATOR 7.0 (GLOVE) ×2
GLOVE ECLIPSE 6.5 STRL STRAW (GLOVE) ×3 IMPLANT
GOWN STRL NON-REIN LRG LVL3 (GOWN DISPOSABLE) ×14 IMPLANT
GRAFT FLEX HD 4X16 THICK (Tissue Mesh) ×1 IMPLANT
IMPL BREAST TIS EXP M 350CC (Breast) IMPLANT
IMPLANT BREAST TIS EXP M 350CC (Breast) ×6 IMPLANT
KIT BASIN OR (CUSTOM PROCEDURE TRAY) ×3 IMPLANT
KIT ROOM TURNOVER OR (KITS) ×3 IMPLANT
NS IRRIG 1000ML POUR BTL (IV SOLUTION) ×6 IMPLANT
PACK GENERAL/GYN (CUSTOM PROCEDURE TRAY) ×3 IMPLANT
PAD ARMBOARD 7.5X6 YLW CONV (MISCELLANEOUS) ×9 IMPLANT
PENCIL BUTTON HOLSTER BLD 10FT (ELECTRODE) ×2 IMPLANT
PIN SAFETY STERILE (MISCELLANEOUS) ×4 IMPLANT
SET ASEPTIC TRANSFER (MISCELLANEOUS) ×1 IMPLANT
SET COLLECT BLD 21X3/4 12 PB (MISCELLANEOUS) ×1 IMPLANT
SPONGE GAUZE 4X4 12PLY (GAUZE/BANDAGES/DRESSINGS) ×5 IMPLANT
SPONGE LAP 18X18 X RAY DECT (DISPOSABLE) ×3 IMPLANT
STAPLER VISISTAT 35W (STAPLE) ×2 IMPLANT
STOCKINETTE IMPERVIOUS 9X36 MD (GAUZE/BANDAGES/DRESSINGS) ×1 IMPLANT
STOPCOCK 4 WAY LG BORE MALE ST (IV SETS) IMPLANT
STRIP CLOSURE SKIN 1/2X4 (GAUZE/BANDAGES/DRESSINGS) ×4 IMPLANT
SUT ETHILON 2 0 FS 18 (SUTURE) ×6 IMPLANT
SUT MNCRL AB 3-0 PS2 18 (SUTURE) ×9 IMPLANT
SUT MNCRL AB 4-0 PS2 18 (SUTURE) ×6 IMPLANT
SUT MON AB 2-0 CT1 36 (SUTURE) ×1 IMPLANT
SUT MON AB 5-0 PS2 18 (SUTURE) ×6 IMPLANT
SUT PDS AB 2-0 CT1 27 (SUTURE) ×12 IMPLANT
SUT SILK 3 0 SH 30 (SUTURE) ×4 IMPLANT
SUT VIC AB 3-0 PS2 18 (SUTURE)
SUT VIC AB 3-0 PS2 18XBRD (SUTURE) ×8 IMPLANT
SUT VIC AB 3-0 SH 27 (SUTURE)
SUT VIC AB 3-0 SH 27X BRD (SUTURE) ×6 IMPLANT
SUT VIC AB 3-0 SH 8-18 (SUTURE) ×5 IMPLANT
SUT VICRYL 4-0 PS2 18IN ABS (SUTURE) ×11 IMPLANT
SYR 50ML SLIP (SYRINGE) ×1 IMPLANT
SYR BULB IRRIGATION 50ML (SYRINGE) ×3 IMPLANT
SYR CONTROL 10ML LL (SYRINGE) ×1 IMPLANT
TOWEL OR 17X24 6PK STRL BLUE (TOWEL DISPOSABLE) ×3 IMPLANT
TOWEL OR 17X26 10 PK STRL BLUE (TOWEL DISPOSABLE) ×4 IMPLANT
TRAY FOLEY CATH 14FRSI W/METER (CATHETERS) ×3 IMPLANT
YANKAUER SUCT BULB TIP NO VENT (SUCTIONS) ×1 IMPLANT

## 2013-05-15 NOTE — Anesthesia Preprocedure Evaluation (Signed)
Anesthesia Evaluation  Patient identified by MRN, date of birth, ID band Patient awake    Reviewed: Allergy & Precautions, H&P , NPO status , Patient's Chart, lab work & pertinent test results  Airway Mallampati: II TM Distance: >3 FB Neck ROM: Full    Dental   Pulmonary sleep apnea ,  breath sounds clear to auscultation        Cardiovascular Rhythm:Regular Rate:Normal     Neuro/Psych    GI/Hepatic   Endo/Other  diabetesMorbid obesity  Renal/GU      Musculoskeletal   Abdominal (+) + obese,   Peds  Hematology   Anesthesia Other Findings   Reproductive/Obstetrics                           Anesthesia Physical Anesthesia Plan  ASA: III  Anesthesia Plan: General   Post-op Pain Management:    Induction: Intravenous  Airway Management Planned: Oral ETT  Additional Equipment:   Intra-op Plan:   Post-operative Plan: Extubation in OR  Informed Consent: I have reviewed the patients History and Physical, chart, labs and discussed the procedure including the risks, benefits and alternatives for the proposed anesthesia with the patient or authorized representative who has indicated his/her understanding and acceptance.     Plan Discussed with: CRNA and Surgeon  Anesthesia Plan Comments:         Anesthesia Quick Evaluation

## 2013-05-15 NOTE — Preoperative (Signed)
Beta Blockers   Reason not to administer Beta Blockers:Not Applicable 

## 2013-05-15 NOTE — Transfer of Care (Signed)
Immediate Anesthesia Transfer of Care Note  Patient: Rita Austin  Procedure(s) Performed: Procedure(s): RIGHT BREAST LATISSIMUS FLAP WITH BREAST EXPLANDER PLACEMENT (Right) LEFT BREAST TISSUE EXPANDER WITH FLEX HD (Left)  Patient Location: PACU  Anesthesia Type:General  Level of Consciousness: awake and responds to stimulation  Airway & Oxygen Therapy: Patient Spontanous Breathing and Patient connected to nasal cannula oxygen  Post-op Assessment: Report given to PACU RN, Post -op Vital signs reviewed and stable and Patient moving all extremities X 4  Post vital signs: Reviewed and stable  Complications: No apparent anesthesia complications

## 2013-05-15 NOTE — Interval H&P Note (Signed)
History and Physical Interval Note:  05/15/2013 7:26 AM  Rita Austin  has presented today for surgery, with the diagnosis of breast cancer  The various methods of treatment have been discussed with the patient and family. After consideration of risks, benefits and other options for treatment, the patient has consented to  Procedure(s): RIGHT BREAST LATISSIMUS FLAP WITH BREAST EXPLANDER PLACEMENT (Right) LEFT BREAST TISSUE EXPANDER WITH FLEX HD (Left) as a surgical intervention .  The patient's history has been reviewed, patient examined, no change in status, stable for surgery.  I have reviewed the patient's chart and labs.  Questions were answered to the patient's satisfaction.     SANGER,Jonathandavid Marlett

## 2013-05-15 NOTE — Anesthesia Procedure Notes (Signed)
Procedure Name: Intubation Date/Time: 05/15/2013 8:36 AM Performed by: Sherie Don Pre-anesthesia Checklist: Patient identified, Emergency Drugs available, Suction available, Patient being monitored and Timeout performed Patient Re-evaluated:Patient Re-evaluated prior to inductionOxygen Delivery Method: Circle system utilized Preoxygenation: Pre-oxygenation with 100% oxygen Intubation Type: IV induction Ventilation: Mask ventilation without difficulty Laryngoscope Size: Mac and 4 Grade View: Grade II Tube type: Oral Tube size: 7.5 mm Number of attempts: 1 Airway Equipment and Method: Stylet Placement Confirmation: ETT inserted through vocal cords under direct vision,  positive ETCO2 and breath sounds checked- equal and bilateral Secured at: 23 cm Tube secured with: Tape Dental Injury: Teeth and Oropharynx as per pre-operative assessment

## 2013-05-15 NOTE — OR Nursing (Signed)
Family called and updated at 75; spoke with Control and instrumentation engineer.

## 2013-05-15 NOTE — Op Note (Addendum)
Preoperative Diagnosis: Breast Cancer s/p bilateral mastectomies ICD-9 174.9  Postoperative Diagnosis: Same  Procedure: 1. Latissimus flap (right) to reconstruct the breast CPT 19361 2. Right breast tissue expander placement 3. Left breast placement of tissue expander and Flex HD  Surgeon: Alan Ripper Sanger  Assistant: Lazaro Arms, PA  EBL: 300 cc  Specimens: None  Drains: 4 total 19 blake round drains  Condition: Stable  Complications: None  Disposition: Recovery Room and then the inpatient service  Procedure in detail:  Patient was seen on the morning of her surgery and marked out for her flap. She was then taken to the operating room and given a general anesthetic. She was given an IV and IV antibiotics. An adequate time out was done. She has SCD's and a foley catheter placed. She was placed into the left lateral decubitus position with all key points padded. She was then prepped and draped in the standard sterile fashion using a chloroprep. The paddle design and position was confirmed. The procedure began by incising the margins of the paddle and dissecting out until all four margins of the muscle were identified. The muscle was then released inferiorly and anteriorly and care was taken not to pick up the serratus anteriorly or the paraspinous muscles posteriorly. The flap was then raised to the scapula and released and rotated medially. Care was taken to protect the vascular pedicle throughout this portion of the procedure. The paddle and muscle looked healthy throughout. I then opened the old mastectomy scar and raised the flaps on top of the pectoralis muscle as there was a reasonable amount of skin. The posterior and anterior pockets were then connected in the plane above the muscle. The muscle from the back and the skin paddle were then rotated into the chest pocket and tacked in place with staples and covered with an ioban dressing. The flap pedicle was inspected and there was no  tension. The back pocket was hemostased and Evicel placed. Two drains were place and secured with 3-0 nylon. The back incision was closed with buried 3-0 Vicryl and subcuticular 4-0 Vicryl 5-0 Monocryl.  Dermabond was applied.     The patient was then repositioned onto her back and the chest was prepped and draped with a betadine. The pocket on the left was inspected and I made sure it was adequately opened and hemostases. The muscle was then secured superiorly to the pectoralis muscle with 3-0 Monocryl. A 350 cc expander was chosen. It was soaked in triple antibiotic solution and evacuated of air and then filled with 20 cc of sterile saline. The inferior portion of the muscle was tacked to the inframammary fold with 3-0 Monocryl. One drain was placed on this side and secured with 3-0 silk. The flap was then closed with 3-0 Vicryl and 4-0 Vicryl followed by a subcuticular 4-0 Monocryl and then dermabond.   Attention was then turned to the left breast.  The superior and inferior flaps were lifted from the Pectoralis muscle and then a pocket was created under the pectoralis for placement of the expander.  The Flex HD was prepared according to the manufacture guidelines and placed at the edge of the muscle with 2-0 PDS and then to the inframammary fold.  The expander was then placed under the pectoralis muscle 350 cc with 50 cc of injectable saline placed in the expander. The expander was secured to the chest wall on each side with 2-0 PDS.  The drain was the placed and secured to the skin  with 3-0 Silk.  The deep layers were closed with 3-0 Vicryl, 4-0 Vicryl followed by 5-0 Monocryl. Dermabond was applied.  The patient was then washed off and placed in a breast binder.  She was allowed to wake up and taken to recovery room in stable condition at the end of the case. The family was notified at the end of the case as well.

## 2013-05-15 NOTE — Brief Op Note (Signed)
05/15/2013  1:54 PM  PATIENT:  Rita Austin  42 y.o. female  PRE-OPERATIVE DIAGNOSIS:  breast cancer  POST-OPERATIVE DIAGNOSIS:  aquired absence of bilateral breasts secondary to breast cancer  PROCEDURE:  Procedure(s): RIGHT BREAST LATISSIMUS FLAP WITH BREAST EXPLANDER PLACEMENT (Right) LEFT BREAST TISSUE EXPANDER WITH FLEX HD (Left)  SURGEON:  Surgeon(s) and Role:    * Claire Sanger, DO - Primary  PHYSICIAN ASSISTANT:   ASSISTANTS: none   ANESTHESIA:   general  EBL:  Total I/O In: 4500 [I.V.:4000; IV Piggyback:500] Out: 400 [Urine:200; Blood:200]  BLOOD ADMINISTERED:none  DRAINS: (4) Jackson-Pratt drain(s) with closed bulb suction in the two in the back and one in each breast pocket   LOCAL MEDICATIONS USED:  NONE  SPECIMEN:  Mastectomy scar  DISPOSITION OF SPECIMEN:  PATHOLOGY  COUNTS:  YES  TOURNIQUET:  * No tourniquets in log *  DICTATION: .Dragon Dictation  PLAN OF CARE: Admit to inpatient   PATIENT DISPOSITION:  PACU - hemodynamically stable.   Delay start of Pharmacological VTE agent (>24hrs) due to surgical blood loss or risk of bleeding: no

## 2013-05-15 NOTE — H&P (View-Only) (Signed)
This document contains confidential information from a [facility] medical record system and may be unauthenticated. Release may be made only with a valid authorization or in accordance with applicable policies of Medical Center or its affiliates. This document must be maintained in a secure manner or discarded/destroyed as required by Medical Center policy or by a confidential means such as shredding.   Rita Austin  04/29/2013 2:00 PM   Office Visit  MRN:  [id number]  Department:  Plastic Surgery  Dept Phone: [phone]      Diagnoses -  Breast cancer, female, right (HCC)    -  Primary   174.9    Acquired absence of bilateral breasts and nipples       V45.71     Vitals - Last Recorded   128/84  86  1.651 m (5\' 5" )  115.667 kg (255 lb)  42.43 kg/m2       Subjective:    Patient ID: Rita Austin is a 42 y.o. female.  HPI  The patient is a 42 yrs old bf here with her mom and husband for a history and physical  for bilateral breast reconstruction.   History: She was diagnosed with right breast cancer in 2011 and underwent chemo followed by bilateral mastectomies and right breast radiation for treatment. She decided to pursue breast reconstruction. She is 5 feet 5 inches tall and 250 pounds. She wore a 44 DD prior to surgery. She has been able to decrease her HgA1C to 6.9. She is otherwise in good health and not had any recent illnesses. There is no significant change in her exam.  The following portions of the patient's history were reviewed and updated as appropriate: allergies, current medications, past family history, past medical history, past social history, past surgical history and problem list.  Review of Systems  All other systems reviewed and are negative.     Objective:    Physical Exam  Constitutional: She is oriented to person, place, and time. She appears well-developed and well-nourished. No distress.  HENT:   Head: Normocephalic and atraumatic.   Nose: Nose  normal.   Mouth/Throat: Oropharynx is clear and moist.  Neck: Normal range of motion. Neck supple. No tracheal deviation present. No thyromegaly present.  Cardiovascular: Normal rate, regular rhythm and intact distal pulses.  Exam reveals no gallop and no friction rub.    No murmur heard. Pulmonary/Chest: Effort normal. No stridor. No respiratory distress. She has no rales. She exhibits no tenderness.  Abdominal: Soft. Bowel sounds are normal. She exhibits no distension and no mass. There is no tenderness.  Musculoskeletal: Normal range of motion.  Neurological: She is alert and oriented to person, place, and time.  Skin: Skin is warm and dry.  Psychiatric: She has a normal mood and affect. Her behavior is normal. Judgment and thought content normal.      Assessment:   1.  Breast cancer, female, right (HCC)    2.  Acquired absence of bilateral breasts and nipples        Plan:   A right latissimus breast reconstruction with expander placement and left expander/FlexHd placement.   The procedure, possible risks, benefits and complications were discussed with the patient and she desires to proceed and consent was obtained.   Medications Ordered This Encounter     cephalexin (KEFLEX) 500 MG capsule Take 1 capsule (500 mg total) by mouth 4 times daily.    HYDROcodone-acetaminophen (NORCO) 5-325 mg per tablet Take 1 tablet by  mouth every 6 (six) hours as needed for up to 10 days for Pain.    promethazine (PHENERGAN) 12.5 MG tablet  Take 1 tablet (12.5 mg total) by mouth every 6 (six) hours as needed for up to 7 days for Nausea.     diazepam (VALIUM) 2 MG tablet 30 Take 1 tablet (2 mg total) by mouth every 6 (six) hours as needed for up to 10 days for Anxiety.

## 2013-05-16 LAB — GLUCOSE, CAPILLARY
Glucose-Capillary: 141 mg/dL — ABNORMAL HIGH (ref 70–99)
Glucose-Capillary: 156 mg/dL — ABNORMAL HIGH (ref 70–99)
Glucose-Capillary: 134 mg/dL — ABNORMAL HIGH (ref 70–99)
Glucose-Capillary: 148 mg/dL — ABNORMAL HIGH (ref 70–99)

## 2013-05-16 MED ORDER — MORPHINE SULFATE 2 MG/ML IJ SOLN: 2.0000 mg | INTRAMUSCULAR | Status: DC | PRN

## 2013-05-16 MED ORDER — INSULIN ASPART 100 UNIT/ML ~~LOC~~ SOLN
0.0000 [IU] | Freq: Three times a day (TID) | SUBCUTANEOUS | Status: DC
  Administered 2013-05-16: 2 [IU] via SUBCUTANEOUS
  Administered 2013-05-16 – 2013-05-17 (×2): 1 [IU] via SUBCUTANEOUS
  Administered 2013-05-17: 2 [IU] via SUBCUTANEOUS
  Administered 2013-05-18 – 2013-05-19 (×2): 1 [IU] via SUBCUTANEOUS

## 2013-05-16 MED ORDER — OXYCODONE HCL ER 15 MG PO T12A
15.0000 mg | EXTENDED_RELEASE_TABLET | Freq: Two times a day (BID) | ORAL | Status: DC
  Administered 2013-05-16 – 2013-05-19 (×7): 15 mg via ORAL
  Filled 2013-05-16 (×7): qty 1

## 2013-05-16 MED ORDER — OXYCODONE HCL 5 MG PO TABS
5.0000 mg | ORAL_TABLET | Freq: Four times a day (QID) | ORAL | Status: DC | PRN
  Administered 2013-05-16 – 2013-05-17 (×2): 5 mg via ORAL
  Filled 2013-05-16 (×2): qty 1

## 2013-05-16 NOTE — Anesthesia Postprocedure Evaluation (Signed)
  Anesthesia Post-op Note  Patient: Rita Austin  Procedure(s) Performed: Procedure(s): RIGHT BREAST LATISSIMUS FLAP WITH BREAST EXPLANDER PLACEMENT (Right) LEFT BREAST TISSUE EXPANDER WITH FLEX HD (Left)  Patient Location: Nursing Unit  Anesthesia Type:General  Level of Consciousness: awake, alert  and oriented  Airway and Oxygen Therapy: Patient Spontanous Breathing and Patient connected to nasal cannula oxygen  Post-op Pain: mild  Post-op Assessment: Post-op Vital signs reviewed, Patient's Cardiovascular Status Stable, Respiratory Function Stable, Patent Airway, No signs of Nausea or vomiting, Adequate PO intake and Pain level controlled  Post-op Vital Signs: Reviewed and stable  Complications: No apparent anesthesia complications

## 2013-05-16 NOTE — Progress Notes (Signed)
UR completed 

## 2013-05-16 NOTE — Progress Notes (Signed)
1 Day Post-Op  Subjective: Post op day 1 from latissimus/expander on the right and expander/flexHD on the left.  Doing well and pain controlled.  Objective: Vital signs in last 24 hours: Temp:  [97.6 F (36.4 C)-99.2 F (37.3 C)] 98.8 F (37.1 C) (08/15 0500) Pulse Rate:  [77-100] 91 (08/15 0500) Resp:  [12-24] 16 (08/15 0500) BP: (107-141)/(68-86) 114/72 mmHg (08/15 0500) SpO2:  [92 %-98 %] 94 % (08/15 0500) FiO2 (%):  [98 %] 98 % (08/14 1937) Last BM Date: 05/15/13  Intake/Output from previous day: 08/14 0701 - 08/15 0700 In: 5841.7 [I.V.:5341.7; IV Piggyback:500] Out: 1295 [Urine:400; Drains:695; Blood:200] Intake/Output this shift:    General appearance: alert, cooperative and no distress Skin: Skin color, texture, turgor normal. No rashes or lesions. Flaps warm and with good capillary refill. Drain output as expected.  Lab Results:   Recent Labs  05/15/13 2135  WBC 8.9  HGB 9.7*  HCT 30.0*  PLT 240   BMET  Recent Labs  05/15/13 2135  NA 134*  K 4.0  CL 100  CO2 25  GLUCOSE 181*  BUN 8  CREATININE 0.67  CALCIUM 8.0*   PT/INR No results found for this basename: LABPROT, INR,  in the last 72 hours ABG No results found for this basename: PHART, PCO2, PO2, HCO3,  in the last 72 hours  Studies/Results: No results found.  Anti-infectives: Anti-infectives   Start     Dose/Rate Route Frequency Ordered Stop   05/15/13 2100  Ampicillin-Sulbactam (UNASYN) 3 g in sodium chloride 0.9 % 100 mL IVPB     3 g 100 mL/hr over 60 Minutes Intravenous Every 6 hours 05/15/13 1928     05/15/13 0920  polymyxin B 500,000 Units, bacitracin 50,000 Units in sodium chloride irrigation 0.9 % 500 mL irrigation  Status:  Discontinued       As needed 05/15/13 0920 05/15/13 1347   05/15/13 0600  ceFAZolin (ANCEF) IVPB 2 g/50 mL premix     2 g 100 mL/hr over 30 Minutes Intravenous On call to O.R. 05/14/13 1411 05/15/13 1245      Assessment/Plan: s/p Procedure(s): RIGHT  BREAST LATISSIMUS FLAP WITH BREAST EXPLANDER PLACEMENT (Right) LEFT BREAST TISSUE EXPANDER WITH FLEX HD (Left) Advance diet  LOS: 1 day    [facility] 05/16/2013

## 2013-05-17 LAB — GLUCOSE, CAPILLARY
Glucose-Capillary: 144 mg/dL — ABNORMAL HIGH (ref 70–99)
Glucose-Capillary: 105 mg/dL — ABNORMAL HIGH (ref 70–99)

## 2013-05-17 MED ORDER — OXYCODONE HCL 5 MG PO TABS
10.0000 mg | ORAL_TABLET | ORAL | Status: DC | PRN
  Administered 2013-05-17 – 2013-05-19 (×12): 10 mg via ORAL
  Filled 2013-05-17 (×13): qty 2

## 2013-05-17 MED ORDER — ONDANSETRON HCL 4 MG/2ML IJ SOLN
4.0000 mg | Freq: Four times a day (QID) | INTRAMUSCULAR | Status: DC
  Administered 2013-05-17 – 2013-05-18 (×5): 4 mg via INTRAVENOUS
  Filled 2013-05-17 (×6): qty 2

## 2013-05-17 MED ORDER — MORPHINE SULFATE 2 MG/ML IJ SOLN: 2.0000 mg | INTRAMUSCULAR | Status: DC | PRN

## 2013-05-17 MED ORDER — BISACODYL 10 MG RE SUPP
10.0000 mg | Freq: Once | RECTAL | Status: AC
  Administered 2013-05-17: 10 mg via RECTAL
  Filled 2013-05-17: qty 1

## 2013-05-17 NOTE — Progress Notes (Signed)
2 Days Post-Op Right latissimus flap and expander placement/ left expander placement with Flex HD Subjective: Pt reports moderate pain in back and some ABD pain in RLQ yesterday. She reports the ABD pain has resolved, but she has not been passing any flatus or having BM's. She had N/V yesterday and has only been able to sip some liquids, but this seems better today as well. She is slightly tachycardic, so will check CBC, BMET in am.   Her back incision is clean, dry and intact and draining serous slightly blood tinged fluid.  Bilateral breast flaps are clean, dry and intact and viable and draining serous, blood tinged fluid. Total drain OP yesterday 680 cc.   Objective: Vital signs in last 24 hours: Temp:  [98.3 F (36.8 C)-100.7 F (38.2 C)] 100.7 F (38.2 C) (08/16 0538) Pulse Rate:  [108-115] 108 (08/16 0538) Resp:  [14-20] 15 (08/16 0940) BP: (101-104)/(58-61) 103/58 mmHg (08/16 0538) SpO2:  [97 %-100 %] 100 % (08/16 0940) Last BM Date: 05/14/13  Intake/Output from previous day: 08/15 0701 - 08/16 0700 In: 1350 [I.V.:1050; IV Piggyback:300] Out: 680 [Drains:680] Intake/Output this shift:    General appearance: alert, cooperative and moderate distress Back: Back incision clean, dry and intact Resp: clear to auscultation bilaterally Cardio: regular rate and rhythm, slightly tachycardic Breast flaps viable and incisions clean, dry and intact. no sign of hematoma or infection  Lab Results:   Recent Labs  05/15/13 2135  WBC 8.9  HGB 9.7*  HCT 30.0*  PLT 240   BMET  Recent Labs  05/15/13 2135  NA 134*  K 4.0  CL 100  CO2 25  GLUCOSE 181*  BUN 8  CREATININE 0.67  CALCIUM 8.0*   PT/INR No results found for this basename: LABPROT, INR,  in the last 72 hours ABG No results found for this basename: PHART, PCO2, PO2, HCO3,  in the last 72 hours  Studies/Results: No results found.  Anti-infectives: Anti-infectives   Start     Dose/Rate Route Frequency Ordered  Stop   05/15/13 2100  Ampicillin-Sulbactam (UNASYN) 3 g in sodium chloride 0.9 % 100 mL IVPB     3 g 100 mL/hr over 60 Minutes Intravenous Every 6 hours 05/15/13 1928     05/15/13 0920  polymyxin B 500,000 Units, bacitracin 50,000 Units in sodium chloride irrigation 0.9 % 500 mL irrigation  Status:  Discontinued       As needed 05/15/13 0920 05/15/13 1347   05/15/13 0600  ceFAZolin (ANCEF) IVPB 2 g/50 mL premix     2 g 100 mL/hr over 30 Minutes Intravenous On call to O.R. 05/14/13 1411 05/15/13 1245      Assessment/Plan: s/p Procedure(s): RIGHT BREAST LATISSIMUS FLAP WITH BREAST EXPLANDER PLACEMENT (Right) LEFT BREAST TISSUE EXPANDER WITH FLEX HD (Left) POD #2- Expected post operative course. Continue to mobilize as able. May have slight ileus, so will try Dulcolax supp this am and switch to po pain medication as PCA may be making this worse.  Continue all JP drains.  Continue management of DM- CBG's 140-180's Continue SCD's Continue antibiotics Check CBC, BMET in am  LOS: 2 days    Franki Monte 05/17/2013 Plastic Surgery [phone]

## 2013-05-18 DIAGNOSIS — M79609 Pain in unspecified limb: Secondary | ICD-10-CM

## 2013-05-18 LAB — CBC
HCT: 27.5 % — ABNORMAL LOW (ref 36.0–46.0)
Hemoglobin: 8.7 g/dL — ABNORMAL LOW (ref 12.0–15.0)
RBC: 3.41 MIL/uL — ABNORMAL LOW (ref 3.87–5.11)
WBC: 11.4 10*3/uL — ABNORMAL HIGH (ref 4.0–10.5)

## 2013-05-18 LAB — GLUCOSE, CAPILLARY
Glucose-Capillary: 89 mg/dL (ref 70–99)
Glucose-Capillary: 134 mg/dL — ABNORMAL HIGH (ref 70–99)
Glucose-Capillary: 116 mg/dL — ABNORMAL HIGH (ref 70–99)

## 2013-05-18 LAB — BASIC METABOLIC PANEL
BUN: 5 mg/dL — ABNORMAL LOW (ref 6–23)
Chloride: 97 mEq/L (ref 96–112)
Glucose, Bld: 87 mg/dL (ref 70–99)
Potassium: 3.2 mEq/L — ABNORMAL LOW (ref 3.5–5.1)

## 2013-05-18 MED ORDER — CEPHALEXIN 500 MG PO CAPS
500.0000 mg | ORAL_CAPSULE | Freq: Four times a day (QID) | ORAL | Status: DC
  Administered 2013-05-18 – 2013-05-19 (×5): 500 mg via ORAL
  Filled 2013-05-18 (×8): qty 1

## 2013-05-18 NOTE — Progress Notes (Signed)
Agree with the above note 

## 2013-05-18 NOTE — Progress Notes (Signed)
Dr. Kelly Splinter notified of ultrasound report on right leg. New order received.

## 2013-05-18 NOTE — Progress Notes (Signed)
3 Days Post-Op  Subjective: Tolerating food well and pain controlled.  However, she complains of pain in her right leg calf area and groin.  This is concerning for DVT.  Objective: Vital signs in last 24 hours: Temp:  [97.8 F (36.6 C)-98.7 F (37.1 C)] 98.4 F (36.9 C) (08/17 0614) Pulse Rate:  [99-108] 99 (08/17 0614) Resp:  [15-18] 18 (08/17 0614) BP: (110-117)/(61-77) 110/63 mmHg (08/17 0614) SpO2:  [96 %-100 %] 96 % (08/17 0614) Last BM Date: 05/14/13 (pta)  Intake/Output from previous day: 08/16 0701 - 08/17 0700 In: 5062.1 [P.O.:960; I.V.:3602.1; IV Piggyback:500] Out: 3605 [Urine:3070; Drains:535] Intake/Output this shift:    General appearance: alert, cooperative and no distress Incision/Wound:  Lab Results:   Recent Labs  05/15/13 2135 05/18/13 0504  WBC 8.9 11.4*  HGB 9.7* 8.7*  HCT 30.0* 27.5*  PLT 240 230   BMET  Recent Labs  05/15/13 2135 05/18/13 0504  NA 134* 132*  K 4.0 3.2*  CL 100 97  CO2 25 28  GLUCOSE 181* 87  BUN 8 5*  CREATININE 0.67 0.71  CALCIUM 8.0* 8.1*   PT/INR No results found for this basename: LABPROT, INR,  in the last 72 hours ABG No results found for this basename: PHART, PCO2, PO2, HCO3,  in the last 72 hours  Studies/Results: No results found.  Anti-infectives: Anti-infectives   Start     Dose/Rate Route Frequency Ordered Stop   05/15/13 2100  Ampicillin-Sulbactam (UNASYN) 3 g in sodium chloride 0.9 % 100 mL IVPB     3 g 100 mL/hr over 60 Minutes Intravenous Every 6 hours 05/15/13 1928     05/15/13 0920  polymyxin B 500,000 Units, bacitracin 50,000 Units in sodium chloride irrigation 0.9 % 500 mL irrigation  Status:  Discontinued       As needed 05/15/13 0920 05/15/13 1347   05/15/13 0600  ceFAZolin (ANCEF) IVPB 2 g/50 mL premix     2 g 100 mL/hr over 30 Minutes Intravenous On call to O.R. 05/14/13 1411 05/15/13 1245      Assessment/Plan: s/p Procedure(s): RIGHT BREAST LATISSIMUS FLAP WITH BREAST EXPLANDER  PLACEMENT (Right) LEFT BREAST TISSUE EXPANDER WITH FLEX HD (Left) check lower extremity dopplers.  If negative then plan d/c.  LOS: 3 days    [facility] 05/18/2013

## 2013-05-18 NOTE — Progress Notes (Signed)
Right lower extremity venous duplex completed.  Right:  No evidence of DVT, superficial thrombosis, or Baker's cyst.  Left:  Negative for DVT in the common femoral vein.  

## 2013-05-18 NOTE — Progress Notes (Signed)
Patient complains of rt groin pain and weakness in right leg when ambulating. No complaints of calf pain. MD aware.

## 2013-05-19 ENCOUNTER — Encounter (HOSPITAL_COMMUNITY): Payer: Self-pay | Admitting: Plastic Surgery

## 2013-05-19 LAB — GLUCOSE, CAPILLARY
Glucose-Capillary: 65 mg/dL — ABNORMAL LOW (ref 70–99)
Glucose-Capillary: 119 mg/dL — ABNORMAL HIGH (ref 70–99)

## 2013-05-19 MED ORDER — BISACODYL 5 MG PO TBEC: 5.0000 mg | DELAYED_RELEASE_TABLET | Freq: Every day | ORAL | Status: DC | PRN

## 2013-05-19 MED ORDER — DSS 100 MG PO CAPS: 100.0000 mg | ORAL_CAPSULE | Freq: Two times a day (BID) | ORAL | Status: DC

## 2013-05-19 MED ORDER — HEPARIN SOD (PORK) LOCK FLUSH 100 UNIT/ML IV SOLN
500.0000 [IU] | INTRAVENOUS | Status: DC
  Filled 2013-05-19: qty 5

## 2013-05-19 MED ORDER — DIAZEPAM 2 MG PO TABS: 2.0000 mg | ORAL_TABLET | Freq: Two times a day (BID) | ORAL | Status: DC | PRN

## 2013-05-19 MED ORDER — ACETAMINOPHEN 325 MG PO TABS: 650.0000 mg | ORAL_TABLET | Freq: Four times a day (QID) | ORAL | Status: DC | PRN

## 2013-05-19 MED ORDER — HEPARIN SOD (PORK) LOCK FLUSH 100 UNIT/ML IV SOLN
500.0000 [IU] | INTRAVENOUS | Status: DC | PRN
  Administered 2013-05-19: 500 [IU]
  Filled 2013-05-19: qty 5

## 2013-05-19 MED ORDER — OXYCODONE HCL 10 MG PO TABS: 10.0000 mg | ORAL_TABLET | ORAL | Status: DC | PRN

## 2013-05-19 MED ORDER — CEPHALEXIN 500 MG PO CAPS: 500.0000 mg | ORAL_CAPSULE | Freq: Four times a day (QID) | ORAL | Status: DC

## 2013-05-19 NOTE — Progress Notes (Signed)
CBG was 65 tonight at 2130.  Pt was give milk graham crackers. On recheck it was 51, at 2150.  PB and Graham Cracker given and rechecked at 2230 came up to 65.  Rechecked again and it was 85 at 2300.

## 2013-05-19 NOTE — Evaluation (Signed)
Physical Therapy Evaluation Patient Details Name: Rita Austin MRN: [id number] DOB: [birth year] Today's Date: 05/19/2013 Time: 6213-0865 PT Time Calculation (min): 16 min  PT Assessment / Plan / Recommendation History of Present Illness  42 y/o female admitted for breast reconstruction.  Developed abd/back pain then cramping R leg pain.  Clinical Impression  Patient evaluated by Physical Therapy with no further acute PT needs identified. All education has been completed and the patient has no further questions.   See below for any follow-up Physial Therapy or equipment needs. PT is signing off. Thank you for this referral.       PT Assessment  Patent does not need any further PT services    Follow Up Recommendations  No PT follow up    Does the patient have the potential to tolerate intense rehabilitation      Barriers to Discharge        Equipment Recommendations  None recommended by PT    Recommendations for Other Services     Frequency      Precautions / Restrictions Precautions Precautions: Fall Restrictions Weight Bearing Restrictions: No   Pertinent Vitals/Pain       Mobility  Bed Mobility Bed Mobility: Supine to Sit;Sit to Supine;Sitting - Scoot to Edge of Bed Supine to Sit: 7: Independent Sitting - Scoot to [place] of Bed: 7: Independent Sit to Supine: 7: Independent Transfers Transfers: Sit to Stand;Stand to Sit Sit to Stand: 7: Independent Stand to Sit: 7: Independent Ambulation/Gait Ambulation/Gait Assistance: 7: Independent Assistive device: None Ambulation/Gait Assistance Details: steady with mild and progressive limp from cramping pain Gait Pattern: Within Functional Limits Stairs: Yes Stair Management Technique: No rails;One rail Right;Alternating pattern;Forwards Number of Stairs: 5    Exercises     PT Diagnosis:    PT Problem List:   PT Treatment Interventions:       PT Goals(Current goals can be found in the care plan section)     Visit Information  Last PT Received On: 05/19/13 Assistance Needed: +1 History of Present Illness: 42 y/o female admitted for breast reconstruction.  Developed abd/back pain then cramping R leg pain.       Prior Functioning  Home Living Family/patient expects to be discharged to:: Private residence Living Arrangements: Spouse/significant other Available Help at Discharge: Family;Available 24 hours/day Type of Home: House Home Layout: One level Home Equipment: None Communication Communication: No difficulties    Cognition  Cognition Arousal/Alertness: Awake/alert Behavior During Therapy: WFL for tasks assessed/performed Overall Cognitive Status: Within Functional Limits for tasks assessed    Extremity/Trunk Assessment Lower Extremity Assessment Lower Extremity Assessment: Generalized weakness;Overall WFL for tasks assessed   Balance    End of Session PT - End of Session Activity Tolerance: Patient tolerated treatment well Patient left: in bed;with family/visitor present Nurse Communication: Mobility status  GP     Rita Austin, Rita Austin 05/19/2013, 12:53 PM 05/19/2013  Rita Austin, PT [phone]  (pager)

## 2013-05-19 NOTE — Progress Notes (Signed)
Agree with the above note 

## 2013-05-19 NOTE — Progress Notes (Signed)
4 Days Post-Op  Subjective: Pt took Valium last night and felt that this helped a lot with her right leg. She feels like she was having some muscle spasms and this was causing her pain and making her leg feel weak. She noted a nice improvement today, but we will have PT see before she leaves and follow any recommendations.  Her breast flaps are viable and healing well and without signs of infection.  Back incision is clean, dry and intact.  JP drain OP as expected.   Objective: Vital signs in last 24 hours: Temp:  [97.9 F (36.6 C)] 97.9 F (36.6 C) (08/18 0605) Pulse Rate:  [95-97] 95 (08/18 0605) Resp:  [18] 18 (08/18 0605) BP: (110-120)/(60-76) 110/65 mmHg (08/18 0605) SpO2:  [97 %-99 %] 99 % (08/18 0605) Last BM Date: 05/14/13  Intake/Output from previous day: 08/17 0701 - 08/18 0700 In: 729.2 [I.V.:429.2; IV Piggyback:300] Out: 300 [Drains:300] Intake/Output this shift: Total I/O In: 360 [P.O.:360] Out: -   General appearance: alert, cooperative and no distress Resp: clear to auscultation bilaterally Cardio: regular rate and rhythm Breast flaps- Her breast flaps are viable and healing well and without signs of infection.  Back incision is clean, dry and intact.  JP drain OP as expected.   Lab Results:   Recent Labs  05/18/13 0504  WBC 11.4*  HGB 8.7*  HCT 27.5*  PLT 230   BMET  Recent Labs  05/18/13 0504  NA 132*  K 3.2*  CL 97  CO2 28  GLUCOSE 87  BUN 5*  CREATININE 0.71  CALCIUM 8.1*   PT/INR No results found for this basename: LABPROT, INR,  in the last 72 hours ABG No results found for this basename: PHART, PCO2, PO2, HCO3,  in the last 72 hours  Studies/Results: No results found.  Anti-infectives: Anti-infectives   Start     Dose/Rate Route Frequency Ordered Stop   05/18/13 1200  cephALEXin (KEFLEX) capsule 500 mg     500 mg Oral 4 times per day 05/18/13 0912     05/15/13 2100  Ampicillin-Sulbactam (UNASYN) 3 g in sodium chloride 0.9 %  100 mL IVPB  Status:  Discontinued     3 g 100 mL/hr over 60 Minutes Intravenous Every 6 hours 05/15/13 1928 05/18/13 0912   05/15/13 0920  polymyxin B 500,000 Units, bacitracin 50,000 Units in sodium chloride irrigation 0.9 % 500 mL irrigation  Status:  Discontinued       As needed 05/15/13 0920 05/15/13 1347   05/15/13 0600  ceFAZolin (ANCEF) IVPB 2 g/50 mL premix     2 g 100 mL/hr over 30 Minutes Intravenous On call to O.R. 05/14/13 1411 05/15/13 1245      Assessment/Plan: s/p Procedure(s): RIGHT BREAST LATISSIMUS FLAP WITH BREAST EXPLANDER PLACEMENT (Right) LEFT BREAST TISSUE EXPANDER WITH FLEX HD (Left)  Doing much better following Valium last night for muscle spasms. Will plan to DC home later today after PT.  follow up in office on Friday, 05/23/13   LOS: 4 days    Franki Monte 05/19/2013 Plastic Surgery [phone]

## 2013-05-19 NOTE — Discharge Summary (Signed)
Physician Discharge Summary  Patient ID: Rita Austin MRN: [id number] DOB/AGE: 1971/03/23 42 y.o.  Admit date: 05/15/2013 Discharge date: 05/19/2013  Admission Diagnoses: Acquired absence of bilateral breasts and nipples   Discharge Diagnoses:  Active Problems:   Acquired absence of breast and absent nipple   Discharged Condition: good  Hospital Course:  The patient is a 42 yrs old bf admitted for bilateral breast reconstruction.    History: She was diagnosed with right breast cancer in 2011 and underwent chemo followed by bilateral mastectomies and right breast radiation for treatment. She decided to pursue breast reconstruction. She is 5 feet 5 inches tall and 250 pounds. She wore a 44 DD prior to surgery. She has been able to decrease her HgA1C to 6.9. She is otherwise in good health and not had any recent illnesses. There is no significant change in her exam.  She was admitted and taken to the OR for right breast latissimus flap and expander placement and left breast expander placement with Flex HD. She developed some right lower extremity and right groin pain post operatively and lower extremity US was done and was negative for DVT. She is undergoing PT evaluation currently and appears to be doing well and will likely be discharged today with the assistance of her family.   Consults: PT  Significant Diagnostic Studies: Bilateral lower extremity venous ultrasound which was negative for DVT bilaterally  Treatments: surgery: Bilateral breast reconstruction with  Right latissimus flap and expander placement and left expander placement with Flex HD   Discharge Exam: Blood pressure 110/65, pulse 95, temperature 97.9 F (36.6 C), temperature source Oral, resp. rate 18, height 5\' 6"  (1.676 m), weight 116.529 kg (256 lb 14.4 oz), SpO2 99.00%. General appearance: alert, cooperative, appears stated age and no distress Back: Right back incision is clean, dry and intact. JP with serous  drainage Resp: clear to auscultation bilaterally Breasts: Bilateral breast flaps are viable, clean, dry and intact and healing without signs of infection. JP with serous drainage Cardio: regular rate and rhythm GI: soft, non-tender; bowel sounds normal; no masses,  no organomegaly Extremities: no edema, redness or tenderness in the calves or thighs  Disposition:   Discharge Orders   Future Orders Complete By Expires   Care order/instruction  As directed    Comments:     Send pt home with recording sheet for JP drains and instruct how to milk and empty drains       Medication List         acetaminophen 325 MG tablet  Commonly known as:  TYLENOL  Take 2 tablets (650 mg total) by mouth every 6 (six) hours as needed.     bisacodyl 5 MG EC tablet  Commonly known as:  DULCOLAX  Take 1 tablet (5 mg total) by mouth daily as needed.     CALCIUM + D PO  Take 2 tablets by mouth every morning.     cephALEXin 500 MG capsule  Commonly known as:  KEFLEX  Take 1 capsule (500 mg total) by mouth every 6 (six) hours.     diazepam 2 MG tablet  Commonly known as:  VALIUM  Take 1 tablet (2 mg total) by mouth every 12 (twelve) hours as needed.     DSS 100 MG Caps  Take 100 mg by mouth 2 (two) times daily.     ferrous sulfate 325 (65 FE) MG tablet  Take 325 mg by mouth daily with breakfast.     insulin  glargine 100 UNIT/ML injection  Commonly known as:  LANTUS  Inject 50 Units into the skin every morning.     insulin lispro 100 UNIT/ML injection  Commonly known as:  HUMALOG  Inject 10 Units into the skin 2 (two) times daily. Takes 10 units at lunch (1230) & at dinner (6pm)     losartan 50 MG tablet  Commonly known as:  COZAAR  Take 50 mg by mouth every morning.     metFORMIN 1000 MG tablet  Commonly known as:  GLUCOPHAGE  Take 1,000 mg by mouth 2 (two) times daily with a meal.     Oxycodone HCl 10 MG Tabs  Take 1 tablet (10 mg total) by mouth every 3 (three) hours as needed.      potassium chloride 10 MEQ tablet  Commonly known as:  K-DUR,KLOR-CON  Take 10 mEq by mouth every morning.     simvastatin 20 MG tablet  Commonly known as:  ZOCOR  Take 20 mg by mouth every morning.           Follow-up Information   Follow up with [facility], DO. Schedule an appointment as soon as possible for a visit on 05/23/2013. (Call for appointment this Friday)    Specialty:  Plastic Surgery   Contact information:   [address] [place] [address] [phone]       Signed: Franki Monte 05/19/2013, 12:29 PM Plastic Surgery [phone]

## 2013-05-19 NOTE — Progress Notes (Signed)
Discharge instructions reviewed with pt and prescriptions given.  Also demonstrated emptying and measuring of JP drains.  Pt verbalized understanding and had no questions.  Pt discharged in stable condition via wheelchair with family.  Hector Shade Waggaman

## 2013-05-20 NOTE — Discharge Summary (Signed)
Agree with the above

## 2013-05-30 DIAGNOSIS — Z9010 Acquired absence of unspecified breast and nipple: Secondary | ICD-10-CM | POA: Insufficient documentation

## 2013-06-26 DIAGNOSIS — D509 Iron deficiency anemia, unspecified: Secondary | ICD-10-CM

## 2013-07-03 DIAGNOSIS — D509 Iron deficiency anemia, unspecified: Secondary | ICD-10-CM

## 2013-07-10 DIAGNOSIS — D509 Iron deficiency anemia, unspecified: Secondary | ICD-10-CM

## 2013-08-07 ENCOUNTER — Other Ambulatory Visit: Payer: Self-pay

## 2013-10-06 ENCOUNTER — Encounter (HOSPITAL_BASED_OUTPATIENT_CLINIC_OR_DEPARTMENT_OTHER): Payer: Self-pay | Admitting: *Deleted

## 2013-10-06 NOTE — Progress Notes (Signed)
Lives in [place] Had other surgeries main or-does use a cpap-will bring and use post op Will need istat

## 2013-10-08 ENCOUNTER — Other Ambulatory Visit: Payer: Self-pay | Admitting: Plastic Surgery

## 2013-10-08 DIAGNOSIS — Z9013 Acquired absence of bilateral breasts and nipples: Secondary | ICD-10-CM

## 2013-10-09 ENCOUNTER — Ambulatory Visit (HOSPITAL_BASED_OUTPATIENT_CLINIC_OR_DEPARTMENT_OTHER): Payer: Medicare Other | Admitting: Anesthesiology

## 2013-10-09 ENCOUNTER — Encounter (HOSPITAL_BASED_OUTPATIENT_CLINIC_OR_DEPARTMENT_OTHER): Payer: Medicare Other | Admitting: Anesthesiology

## 2013-10-09 ENCOUNTER — Ambulatory Visit (HOSPITAL_BASED_OUTPATIENT_CLINIC_OR_DEPARTMENT_OTHER)
Admission: RE | Admit: 2013-10-09 | Discharge: 2013-10-09 | Disposition: A | Discharge status: Home / Self Care | Payer: Medicare Other | Source: Ambulatory Visit | Attending: Plastic Surgery | Admitting: Plastic Surgery

## 2013-10-09 ENCOUNTER — Encounter (HOSPITAL_BASED_OUTPATIENT_CLINIC_OR_DEPARTMENT_OTHER): Payer: Self-pay | Admitting: Plastic Surgery

## 2013-10-09 ENCOUNTER — Encounter (HOSPITAL_BASED_OUTPATIENT_CLINIC_OR_DEPARTMENT_OTHER)
Admission: RE | Disposition: A | Discharge status: Home / Self Care | Payer: Self-pay | Source: Ambulatory Visit | Attending: Plastic Surgery

## 2013-10-09 DIAGNOSIS — Z853 Personal history of malignant neoplasm of breast: Secondary | ICD-10-CM | POA: Insufficient documentation

## 2013-10-09 DIAGNOSIS — G4730 Sleep apnea, unspecified: Secondary | ICD-10-CM | POA: Insufficient documentation

## 2013-10-09 DIAGNOSIS — Z4430 Encounter for fitting and adjustment of external breast prosthesis, unspecified breast: Secondary | ICD-10-CM | POA: Insufficient documentation

## 2013-10-09 DIAGNOSIS — Z9013 Acquired absence of bilateral breasts and nipples: Secondary | ICD-10-CM

## 2013-10-09 DIAGNOSIS — I10 Essential (primary) hypertension: Secondary | ICD-10-CM | POA: Insufficient documentation

## 2013-10-09 DIAGNOSIS — Z6838 Body mass index (BMI) 38.0-38.9, adult: Secondary | ICD-10-CM | POA: Insufficient documentation

## 2013-10-09 DIAGNOSIS — E119 Type 2 diabetes mellitus without complications: Secondary | ICD-10-CM | POA: Insufficient documentation

## 2013-10-09 DIAGNOSIS — Z794 Long term (current) use of insulin: Secondary | ICD-10-CM | POA: Insufficient documentation

## 2013-10-09 DIAGNOSIS — Z9010 Acquired absence of unspecified breast and nipple: Secondary | ICD-10-CM | POA: Insufficient documentation

## 2013-10-09 HISTORY — PX: PORT-A-CATH REMOVAL: SHX5289

## 2013-10-09 HISTORY — DX: Essential (primary) hypertension: I10

## 2013-10-09 HISTORY — PX: REMOVAL OF TISSUE EXPANDER AND PLACEMENT OF IMPLANT: SHX6457

## 2013-10-09 HISTORY — PX: LIPOSUCTION: SHX10

## 2013-10-09 LAB — POCT I-STAT, CHEM 8
BUN: 9 mg/dL (ref 6–23)
CREATININE: 0.7 mg/dL (ref 0.50–1.10)
Calcium, Ion: 1.23 mmol/L (ref 1.12–1.23)
Chloride: 100 mEq/L (ref 96–112)
GLUCOSE: 354 mg/dL — AB (ref 70–99)
HCT: 45 % (ref 36.0–46.0)
HEMOGLOBIN: 15.3 g/dL — AB (ref 12.0–15.0)
Potassium: 4.2 mEq/L (ref 3.7–5.3)
SODIUM: 138 meq/L (ref 137–147)
TCO2: 22 mmol/L (ref 0–100)

## 2013-10-09 LAB — GLUCOSE, CAPILLARY: Glucose-Capillary: 321 mg/dL — ABNORMAL HIGH (ref 70–99)

## 2013-10-09 SURGERY — REMOVAL, TISSUE EXPANDER, BREAST, WITH IMPLANT INSERTION
Anesthesia: General | Site: Chest | Laterality: Left

## 2013-10-09 MED ORDER — CEFAZOLIN SODIUM 1-5 GM-% IV SOLN
INTRAVENOUS | Status: AC
  Filled 2013-10-09: qty 100

## 2013-10-09 MED ORDER — LIDOCAINE-EPINEPHRINE 1 %-1:100000 IJ SOLN
INTRAMUSCULAR | Status: AC
  Filled 2013-10-09: qty 1

## 2013-10-09 MED ORDER — OXYCODONE HCL 5 MG/5ML PO SOLN: 5.0000 mg | Freq: Once | ORAL | Status: DC | PRN

## 2013-10-09 MED ORDER — MIDAZOLAM HCL 2 MG/2ML IJ SOLN: 1.0000 mg | INTRAMUSCULAR | Status: DC | PRN

## 2013-10-09 MED ORDER — CEFAZOLIN SODIUM-DEXTROSE 2-3 GM-% IV SOLR
2.0000 g | INTRAVENOUS | Status: AC
  Administered 2013-10-09: 2 g via INTRAVENOUS

## 2013-10-09 MED ORDER — FENTANYL CITRATE 0.05 MG/ML IJ SOLN: 50.0000 ug | INTRAMUSCULAR | Status: DC | PRN

## 2013-10-09 MED ORDER — SUFENTANIL CITRATE 50 MCG/ML IV SOLN
INTRAVENOUS | Status: AC
  Filled 2013-10-09: qty 1

## 2013-10-09 MED ORDER — ONDANSETRON HCL 4 MG/2ML IJ SOLN
INTRAMUSCULAR | Status: DC | PRN
  Administered 2013-10-09 (×2): 4 mg via INTRAVENOUS

## 2013-10-09 MED ORDER — EPINEPHRINE HCL 1 MG/ML IJ SOLN
INTRAMUSCULAR | Status: AC
  Filled 2013-10-09: qty 1

## 2013-10-09 MED ORDER — LIDOCAINE HCL 1 % IJ SOLN: INTRAVENOUS | Status: DC | PRN

## 2013-10-09 MED ORDER — HYDROMORPHONE HCL PF 1 MG/ML IJ SOLN
INTRAMUSCULAR | Status: AC
  Filled 2013-10-09: qty 1

## 2013-10-09 MED ORDER — ONDANSETRON HCL 4 MG/2ML IJ SOLN: 4.0000 mg | Freq: Once | INTRAMUSCULAR | Status: DC | PRN

## 2013-10-09 MED ORDER — SUFENTANIL CITRATE 50 MCG/ML IV SOLN
INTRAVENOUS | Status: DC | PRN
  Administered 2013-10-09 (×3): 10 ug via INTRAVENOUS

## 2013-10-09 MED ORDER — PROPOFOL 10 MG/ML IV BOLUS
INTRAVENOUS | Status: DC | PRN
  Administered 2013-10-09: 300 mg via INTRAVENOUS
  Administered 2013-10-09: 50 mg via INTRAVENOUS

## 2013-10-09 MED ORDER — BUPIVACAINE-EPINEPHRINE 0.25% -1:200000 IJ SOLN
INTRAMUSCULAR | Status: DC | PRN
  Administered 2013-10-09: 4 mL

## 2013-10-09 MED ORDER — LACTATED RINGERS IV SOLN
INTRAVENOUS | Status: DC | PRN
  Administered 2013-10-09 (×2): via INTRAVENOUS

## 2013-10-09 MED ORDER — LACTATED RINGERS IV SOLN
INTRAVENOUS | Status: DC
  Administered 2013-10-09: 09:00:00 via INTRAVENOUS

## 2013-10-09 MED ORDER — LIDOCAINE HCL (PF) 1 % IJ SOLN
INTRAMUSCULAR | Status: AC
  Filled 2013-10-09: qty 60

## 2013-10-09 MED ORDER — OXYCODONE HCL 5 MG PO TABS: 5.0000 mg | ORAL_TABLET | Freq: Once | ORAL | Status: DC | PRN

## 2013-10-09 MED ORDER — SODIUM BICARBONATE 4 % IV SOLN
INTRAVENOUS | Status: DC | PRN
  Administered 2013-10-09: 10:00:00 via INTRAMUSCULAR

## 2013-10-09 MED ORDER — HYDROMORPHONE HCL PF 1 MG/ML IJ SOLN
0.2500 mg | INTRAMUSCULAR | Status: DC | PRN
  Administered 2013-10-09 (×2): 0.5 mg via INTRAVENOUS

## 2013-10-09 MED ORDER — LIDOCAINE HCL (CARDIAC) 20 MG/ML IV SOLN
INTRAVENOUS | Status: DC | PRN
  Administered 2013-10-09: 75 mg via INTRAVENOUS

## 2013-10-09 MED ORDER — SUCCINYLCHOLINE CHLORIDE 20 MG/ML IJ SOLN
INTRAMUSCULAR | Status: DC | PRN
  Administered 2013-10-09: 100 mg via INTRAVENOUS

## 2013-10-09 MED ORDER — MIDAZOLAM HCL 2 MG/2ML IJ SOLN
INTRAMUSCULAR | Status: AC
  Filled 2013-10-09: qty 2

## 2013-10-09 MED ORDER — SODIUM CHLORIDE 0.9 % IR SOLN
Status: DC | PRN
  Administered 2013-10-09: 10:00:00

## 2013-10-09 MED ORDER — MIDAZOLAM HCL 5 MG/5ML IJ SOLN
INTRAMUSCULAR | Status: DC | PRN
  Administered 2013-10-09: 2 mg via INTRAVENOUS

## 2013-10-09 SURGICAL SUPPLY — 96 items
ADH SKN CLS APL DERMABOND .7 (GAUZE/BANDAGES/DRESSINGS) ×6
BAG DECANTER FOR FLEXI CONT (MISCELLANEOUS) ×5 IMPLANT
BINDER BREAST LRG (GAUZE/BANDAGES/DRESSINGS) IMPLANT
BINDER BREAST MEDIUM (GAUZE/BANDAGES/DRESSINGS) IMPLANT
BINDER BREAST XLRG (GAUZE/BANDAGES/DRESSINGS) ×3 IMPLANT
BINDER BREAST XXLRG (GAUZE/BANDAGES/DRESSINGS) IMPLANT
BIOPATCH RED 1 DISK 7.0 (GAUZE/BANDAGES/DRESSINGS) IMPLANT
BIOPATCH RED 1IN DISK 7.0MM (GAUZE/BANDAGES/DRESSINGS)
BLADE HEX COATED 2.75 (ELECTRODE) ×5 IMPLANT
BLADE SURG 10 STRL SS (BLADE) IMPLANT
BLADE SURG 15 STRL LF DISP TIS (BLADE) ×3 IMPLANT
BLADE SURG 15 STRL SS (BLADE) ×5
BLADE SURG ROTATE 9660 (MISCELLANEOUS) IMPLANT
BNDG GAUZE ELAST 4 BULKY (GAUZE/BANDAGES/DRESSINGS) ×10 IMPLANT
CANISTER LIPO FAT HARVEST (MISCELLANEOUS) ×5 IMPLANT
CANISTER SUCT 1200ML W/VALVE (MISCELLANEOUS) ×5 IMPLANT
CANNULA ASPIRATION (CANNULA) ×5 IMPLANT
CHLORAPREP W/TINT 26ML (MISCELLANEOUS) ×5 IMPLANT
CLOSURE WOUND 1/2 X4 (GAUZE/BANDAGES/DRESSINGS)
CORDS BIPOLAR (ELECTRODE) IMPLANT
COVER MAYO STAND STRL (DRAPES) ×5 IMPLANT
COVER TABLE BACK 60X90 (DRAPES) ×5 IMPLANT
DECANTER SPIKE VIAL GLASS SM (MISCELLANEOUS) IMPLANT
DERMABOND ADVANCED (GAUZE/BANDAGES/DRESSINGS) ×4
DERMABOND ADVANCED .7 DNX12 (GAUZE/BANDAGES/DRESSINGS) ×6 IMPLANT
DRAIN CHANNEL 19F RND (DRAIN) IMPLANT
DRAPE LAPAROSCOPIC ABDOMINAL (DRAPES) ×5 IMPLANT
DRAPE PED LAPAROTOMY (DRAPES) ×5 IMPLANT
DRSG TEGADERM 2-3/8X2-3/4 SM (GAUZE/BANDAGES/DRESSINGS) IMPLANT
ELECT BLADE 4.0 EZ CLEAN MEGAD (MISCELLANEOUS) ×5
ELECT COATED BLADE 2.86 ST (ELECTRODE) IMPLANT
ELECT REM PT RETURN 9FT ADLT (ELECTROSURGICAL) ×5
ELECTRODE BLDE 4.0 EZ CLN MEGD (MISCELLANEOUS) ×3 IMPLANT
ELECTRODE REM PT RTRN 9FT ADLT (ELECTROSURGICAL) ×3 IMPLANT
EVACUATOR SILICONE 100CC (DRAIN) IMPLANT
FILTER LIPOSUCTION (MISCELLANEOUS) ×2 IMPLANT
GLOVE BIO SURGEON STRL SZ 6.5 (GLOVE) ×10 IMPLANT
GLOVE BIO SURGEONS STRL SZ 6.5 (GLOVE) ×3
GLOVE SURG SS PI 7.0 STRL IVOR (GLOVE) ×3 IMPLANT
GOWN STRL REUS W/ TWL LRG LVL3 (GOWN DISPOSABLE) ×7 IMPLANT
GOWN STRL REUS W/TWL LRG LVL3 (GOWN DISPOSABLE) ×15
IMPL GEL 500CC (Breast) ×1 IMPLANT
IMPL GEL HI PROFILE 350CC (Breast) ×1 IMPLANT
IMPLANT GEL 500CC (Breast) ×5 IMPLANT
IMPLANT GEL HI PROFILE 350CC (Breast) ×5 IMPLANT
IV NS 1000ML (IV SOLUTION)
IV NS 1000ML BAXH (IV SOLUTION) IMPLANT
IV NS 500ML (IV SOLUTION)
IV NS 500ML BAXH (IV SOLUTION) ×2 IMPLANT
KIT FILL SYSTEM UNIVERSAL (SET/KITS/TRAYS/PACK) ×5 IMPLANT
LINER CANISTER 1000CC FLEX (MISCELLANEOUS) ×5 IMPLANT
NDL HYPO 25X1 1.5 SAFETY (NEEDLE) IMPLANT
NDL SAFETY ECLIPSE 18X1.5 (NEEDLE) ×3 IMPLANT
NDL SPNL 18GX3.5 QUINCKE PK (NEEDLE) ×2 IMPLANT
NEEDLE 27GAX1X1/2 (NEEDLE) ×5 IMPLANT
NEEDLE HYPO 18GX1.5 SHARP (NEEDLE) ×5
NEEDLE HYPO 25X1 1.5 SAFETY (NEEDLE) IMPLANT
NEEDLE SPNL 18GX3.5 QUINCKE PK (NEEDLE) ×5 IMPLANT
NS IRRIG 1000ML POUR BTL (IV SOLUTION) IMPLANT
PACK BASIN DAY SURGERY FS (CUSTOM PROCEDURE TRAY) ×5 IMPLANT
PAD ABD 8X10 STRL (GAUZE/BANDAGES/DRESSINGS) ×7 IMPLANT
PAD ALCOHOL SWAB (MISCELLANEOUS) ×5 IMPLANT
PENCIL BUTTON HOLSTER BLD 10FT (ELECTRODE) ×5 IMPLANT
PIN SAFETY STERILE (MISCELLANEOUS) IMPLANT
SIZER BREAST 450CC (SIZER) ×5
SIZER BREAST HP REUSE 500CC (SIZER) ×5
SIZER BREAST REUSE STRL 350CC (SIZER) ×5
SIZER BRST HP REUSE 500CC (SIZER) ×1 IMPLANT
SIZER BRST P5.1XHI 450CC (SIZER) ×1 IMPLANT
SIZER BRST REUSE STRL 350CC (SIZER) ×1 IMPLANT
SIZER GENERIC MENTOR (SIZER) ×9 IMPLANT
SLEEVE SCD COMPRESS KNEE MED (MISCELLANEOUS) ×5 IMPLANT
SPONGE GAUZE 2X2 8PLY STER LF (GAUZE/BANDAGES/DRESSINGS)
SPONGE GAUZE 2X2 8PLY STRL LF (GAUZE/BANDAGES/DRESSINGS) IMPLANT
SPONGE GAUZE 4X4 12PLY STER LF (GAUZE/BANDAGES/DRESSINGS) IMPLANT
SPONGE LAP 18X18 X RAY DECT (DISPOSABLE) ×10 IMPLANT
STRIP CLOSURE SKIN 1/2X4 (GAUZE/BANDAGES/DRESSINGS) IMPLANT
SUT MNCRL AB 4-0 PS2 18 (SUTURE) ×14 IMPLANT
SUT MON AB 5-0 PS2 18 (SUTURE) ×13 IMPLANT
SUT PDS AB 2-0 CT2 27 (SUTURE) ×6 IMPLANT
SUT VIC AB 3-0 SH 27 (SUTURE) ×30
SUT VIC AB 3-0 SH 27X BRD (SUTURE) ×8 IMPLANT
SUT VICRYL 4-0 PS2 18IN ABS (SUTURE) ×2 IMPLANT
SYR 20CC LL (SYRINGE) IMPLANT
SYR 50ML LL SCALE MARK (SYRINGE) ×5 IMPLANT
SYR BULB IRRIGATION 50ML (SYRINGE) ×5 IMPLANT
SYR CONTROL 10ML LL (SYRINGE) ×5 IMPLANT
SYR TB 1ML LL NO SAFETY (SYRINGE) ×2 IMPLANT
SYRINGE TOOMEY DISP (SYRINGE) IMPLANT
TOWEL OR 17X24 6PK STRL BLUE (TOWEL DISPOSABLE) ×10 IMPLANT
TRAY DSU PREP LF (CUSTOM PROCEDURE TRAY) ×5 IMPLANT
TUBE CONNECTING 20'X1/4 (TUBING) ×1
TUBE CONNECTING 20X1/4 (TUBING) ×4 IMPLANT
TUBING SET GRADUATE ASPIR 12FT (MISCELLANEOUS) ×5 IMPLANT
UNDERPAD 30X30 INCONTINENT (UNDERPADS AND DIAPERS) ×10 IMPLANT
YANKAUER SUCT BULB TIP NO VENT (SUCTIONS) ×5 IMPLANT

## 2013-10-09 NOTE — Anesthesia Procedure Notes (Signed)
Procedure Name: Intubation Date/Time: 10/09/2013 9:08 AM Performed by: Melynda Ripple D Pre-anesthesia Checklist: Patient identified, Emergency Drugs available, Suction available and Patient being monitored Patient Re-evaluated:Patient Re-evaluated prior to inductionOxygen Delivery Method: Circle System Utilized Preoxygenation: Pre-oxygenation with 100% oxygen Intubation Type: IV induction Ventilation: Mask ventilation without difficulty Laryngoscope Size: Miller and 2 Grade View: Grade I Tube type: Oral Tube size: 7.0 mm Number of attempts: 1 Airway Equipment and Method: stylet and oral airway Placement Confirmation: ETT inserted through vocal cords under direct vision,  positive ETCO2 and breath sounds checked- equal and bilateral Secured at: 22 cm Tube secured with: Tape Dental Injury: Teeth and Oropharynx as per pre-operative assessment

## 2013-10-09 NOTE — Anesthesia Postprocedure Evaluation (Signed)
  Anesthesia Post-op Note  Patient: Rita Austin  Procedure(s) Performed: Procedure(s): REMOVAL OF BILATERAL TISSUE EXPANDER AND PLACEMENT OF BILATERAL IMPLANT (Bilateral) REMOVAL PORT-A-CATH LEFT CHEST  (Left) LIPOSUCTION (Bilateral)  Patient Location: PACU  Anesthesia Type:General  Level of Consciousness: awake, alert  and oriented  Airway and Oxygen Therapy: Patient Spontanous Breathing and Patient connected to face mask oxygen  Post-op Pain: mild  Post-op Assessment: Post-op Vital signs reviewed  Post-op Vital Signs: Reviewed  Complications: No apparent anesthesia complications

## 2013-10-09 NOTE — Transfer of Care (Signed)
Immediate Anesthesia Transfer of Care Note  Patient: Rita Austin  Procedure(s) Performed: Procedure(s): REMOVAL OF BILATERAL TISSUE EXPANDER AND PLACEMENT OF BILATERAL IMPLANT (Bilateral) REMOVAL PORT-A-CATH LEFT CHEST  (Left) LIPOSUCTION (Bilateral)  Patient Location: PACU  Anesthesia Type:General  Level of Consciousness: awake, alert  and oriented  Airway & Oxygen Therapy: Patient Spontanous Breathing and Patient connected to face mask oxygen  Post-op Assessment: Report given to PACU RN and Post -op Vital signs reviewed and stable  Post vital signs: Reviewed and stable  Complications: No apparent anesthesia complications

## 2013-10-09 NOTE — Anesthesia Preprocedure Evaluation (Signed)
Anesthesia Evaluation  Patient identified by MRN, date of birth, ID band Patient awake    Reviewed: Allergy & Precautions, H&P , NPO status , Patient's Chart, lab work & pertinent test results  Airway Mallampati: I TM Distance: >3 FB Neck ROM: Full    Dental  (+) Teeth Intact and Dental Advisory Given   Pulmonary sleep apnea and Continuous Positive Airway Pressure Ventilation ,  breath sounds clear to auscultation        Cardiovascular hypertension, Pt. on medications Rhythm:Regular     Neuro/Psych    GI/Hepatic   Endo/Other  diabetes, Well Controlled, Insulin DependentMorbid obesity  Renal/GU      Musculoskeletal   Abdominal   Peds  Hematology   Anesthesia Other Findings   Reproductive/Obstetrics                           Anesthesia Physical Anesthesia Plan  ASA: III  Anesthesia Plan: General   Post-op Pain Management:    Induction: Intravenous  Airway Management Planned: Oral ETT  Additional Equipment:   Intra-op Plan:   Post-operative Plan: Extubation in OR  Informed Consent: I have reviewed the patients History and Physical, chart, labs and discussed the procedure including the risks, benefits and alternatives for the proposed anesthesia with the patient or authorized representative who has indicated his/her understanding and acceptance.   Dental advisory given  Plan Discussed with: CRNA, Anesthesiologist and Surgeon  Anesthesia Plan Comments:         Anesthesia Quick Evaluation

## 2013-10-09 NOTE — H&P (Signed)
Rita Austin  09/23/2013 9:30 AM   Office Visit  MRN:  [id number]  Department: [facility]  Dept Phone: [phone]  Description: Female DOB: [birth year]  Provider: Lyndee Leo Sanger   Diagnoses    Acquired absence of bilateral breasts and nipples       V45.71    Breast cancer, left ([place])      174.9     Vitals - Last Recorded    146/92  91  1.651 m (5\' 5" )  113.399 kg (250 lb)  41.60 kg/m2       Subjective:    Patient ID: Rita Austin is a 43 y.o. female.  HPI The patient is a 43 yrs old bf here for history and physical for secondary breast reconstruction with removal of bilateral expanders and placement of bilateral silicone implants.   History:  She was diagnosed with right breast cancer in 2011 and underwent chemo followed by bilateral mastectomies and right breast radiation for treatment. She decided to pursue breast reconstruction. She is 5 feet 5 inches tall and 250 pounds. She wore a 84 DD prior to surgery. She has been able to decrease her HgA1C to 6.9. She is otherwise in good health and not had any recent illnesses. She underwent a right latissimus reconstruction with expander placement and left expander/ADM placement.  We have been expanding her prosthetics and she is tolerating it very well. She is currently wearing a 42 D bra and placing implants in the bra.  We discussed the need for her to transition to not wearing the prosthetics and only the bra to adjust to her current size and she has started to eliminate the fillers. The right latissimus flap is viable and well healed and has 340/350 cc in the expander. The left breast flap is viable and well healed and has 440/350 cc in the expander.  The following portions of the patient's history were reviewed and updated as appropriate: allergies, current medications, past family history, past medical history, past social history, past surgical history and problem list.  Review of Systems  All other systems reviewed and are  negative.     Objective:    Physical Exam  Constitutional: She is oriented to person, place, and time. She appears well-developed and well-nourished. No distress.  HENT:   Head: Normocephalic and atraumatic.   Nose: Nose normal.   Mouth/Throat: Oropharynx is clear and moist.  Eyes: EOM are normal. Pupils are equal, round, and reactive to light.  Neck: Normal range of motion. Neck supple. No tracheal deviation present. No thyromegaly present.  Cardiovascular: Normal rate, regular rhythm and intact distal pulses.  Exam reveals no gallop and no friction rub.    No murmur heard. Pulmonary/Chest: Effort normal and breath sounds normal. No stridor. No respiratory distress. She has no wheezes.  Abdominal: Soft. Bowel sounds are normal. She exhibits no distension and no mass. There is no tenderness.  Old well healed mid-line abdominal incision  Neurological: She is alert and oriented to person, place, and time. No cranial nerve deficit. She exhibits normal muscle tone. Coordination normal.  Skin: Skin is warm and dry. No rash noted. No erythema.  Psychiatric: She has a normal mood and affect. Her behavior is normal. Judgment and thought content normal.      Assessment:     Acquired absence of bilateral breasts and nipples        Plan:   Plan secondary exchange with removal of bilateral expanders and placement of bilateral silicone implants. The  procedure, possible risks, benefits and complications were discussed with the patient and she desires to proceed and consent was obtained.    Medications Ordered This Encounter   cephalexin (KEFLEX) 500 MG capsule Take 1 capsule (500 mg total) by mouth 4 times daily.   HYDROcodone-acetaminophen (NORCO) 5-325 mg per tablet Take 1 tablet by mouth every 6 (six) hours as needed for up to 10 days for Pain.     LORazepam (ATIVAN) 1 MG tablet Take 1 tablet (1 mg total) by mouth every 6 (six) hours as needed for up to 10 days for Anxiety.

## 2013-10-09 NOTE — Brief Op Note (Signed)
10/09/2013  11:34 AM  PATIENT:  Rita Austin  43 y.o. female  PRE-OPERATIVE DIAGNOSIS:  history of breast cancer  POST-OPERATIVE DIAGNOSIS:  history of breast cancer  PROCEDURE:  Procedure(s): REMOVAL OF BILATERAL TISSUE EXPANDER AND PLACEMENT OF BILATERAL IMPLANT (Bilateral) REMOVAL PORT-A-CATH LEFT CHEST  (Left) LIPOSUCTION (Bilateral)  SURGEON:  Surgeon(s) and Role:    * Claire Sanger, DO - Primary  PHYSICIAN ASSISTANT: Shawn Rayburn, PA  ASSISTANTS: none   ANESTHESIA:   general  EBL:  Total I/O In: 1000 [I.V.:1000] Out: -   BLOOD ADMINISTERED:none  DRAINS: none   LOCAL MEDICATIONS USED:  MARCAINE  and LIDOCAINE   SPECIMEN:  Right breast capsule  DISPOSITION OF SPECIMEN:  PATHOLOGY  COUNTS:  YES  TOURNIQUET:  * No tourniquets in log *  DICTATION: .Dragon Dictation  PLAN OF CARE: Discharge to home after PACU  PATIENT DISPOSITION:  PACU - hemodynamically stable.   Delay start of Pharmacological VTE agent (>24hrs) due to surgical blood loss or risk of bleeding: no

## 2013-10-09 NOTE — Op Note (Addendum)
Op report Bilateral Exchange   DATE OF OPERATION: 10/09/2013  LOCATION: [facility]  SURGICAL DIVISION: Plastic Surgery  PREOPERATIVE DIAGNOSES:  1. History of breast cancer.  2. Acquired absence of bilateral breast.   POSTOPERATIVE DIAGNOSES:  1. History of breast cancer.  2. Acquired absence of bilateral breast.   PROCEDURE:  1. Bilateral exchange of tissue expanders for implants.  2. Bilateral capsulotomies for implant respositioning. 3. Removal of Port-a-cath.  SURGEON: Theodoro Kos, DO  ASSISTANT: Shawn Rayburn, PA  ANESTHESIA:  General.   COMPLICATIONS: None.   IMPLANTS: Left - Mentor Smooth Round High Profile Gel 500cc. Ref #[id number].  Serial Number [id number] Right - Mentor Smooth Round High Profile Gel 352cc. Ref #[id number].  Serial Number [id number]  INDICATIONS FOR PROCEDURE:  The patient, Rita Austin, is a 43 y.o. female born on [birth year], is here for treatment after bilateral mastectomies.  She had tissue expanders placed at the time of mastectomies. She now presents for exchange of her expanders for implants.  She requires capsulotomies to better position the implants.   CONSENT:  Informed consent was obtained directly from the patient. Risks, benefits and alternatives were fully discussed. Specific risks including but not limited to bleeding, infection, hematoma, seroma, scarring, pain, implant infection, implant extrusion, capsular contracture, asymmetry, wound healing problems, and need for further surgery were all discussed. The patient did have an ample opportunity to have her questions answered to her satisfaction.   DESCRIPTION OF PROCEDURE:  The patient was taken to the operating room. SCDs were placed and IV antibiotics were given. The patient's chest was prepped and draped in a sterile fashion. A time out was performed and the implants to be used were identified.  One percent Lidocaine with epinephrine was used to infiltrate the area.    The old mastectomy scar was opened and superior mastectomy and inferior mastectomy flaps were re-raised over the pectoralis major muscle on the left. The pectoralis was split to expose the tissue expander which was removed. Inspection of the pocket showed a normal healthy capsule and good integration of the biologic matrix. On the right, the bovie was used to dissect down to the latissimus muscle flap at the inframammary fold. Sizers were used to aide in the decision for the implant sizes.  Circumferencial capsulotomies were performed on each breast to allow for breast pocket expansion on either side.  The inferior capsule was excised on the right and the lateral capsule was excised on the left in order to bring the pocket more medially.  This was sutured with 2-0 PDS. Measurements were made on either side to confirm adequate pocket size for the implant dimensions.  Hemostasis was ensured. Gloves were changed. The implants were placed in the pockets and oriented appropriately. The pectoralis major muscle and capsule on the anterior surface were re-closed with a 3-0 running Vicryl suture. The remaining skin was closed with 4-0 Monocryl deep dermal and 5-0 Monocryl subcuticular stitches.    Attention was then turned to the port-a-cath with was removed.  The skin was incised with a #15 blade. And the bovie was used to dissect down to the port.  The sutures around the port were removed.  A 4-0 Monocryl was used the suture closed the area around the tube. The port was removed. The deep layers were closed with 4-0 Monocryl followed by a 5-0 Monocryl.  Dermabond was placed on all incision. A breast binder and ABDs were placed.  The patient was awakened from anesthesia and taken  to the recovery room in satisfactory condition. The patient tolerated the procedure well and there were no complications.

## 2013-10-09 NOTE — Discharge Instructions (Signed)
May shower starting tomorrow Continue binder or sports bra   Post Anesthesia Home Care Instructions  Activity: Get plenty of rest for the remainder of the day. A responsible adult should stay with you for 24 hours following the procedure.  For the next 24 hours, DO NOT: -Drive a car -Paediatric nurse -Drink alcoholic beverages -Take any medication unless instructed by your physician -Make any legal decisions or sign important papers.  Meals: Start with liquid foods such as gelatin or soup. Progress to regular foods as tolerated. Avoid greasy, spicy, heavy foods. If nausea and/or vomiting occur, drink only clear liquids until the nausea and/or vomiting subsides. Call your physician if vomiting continues.  Special Instructions/Symptoms: Your throat may feel dry or sore from the anesthesia or the breathing tube placed in your throat during surgery. If this causes discomfort, gargle with warm salt water. The discomfort should disappear within 24 hours.

## 2013-10-09 NOTE — Interval H&P Note (Signed)
History and Physical Interval Note:  10/09/2013 8:39 AM  Rita Austin  has presented today for surgery, with the diagnosis of history of breast cancer  The various methods of treatment have been discussed with the patient and family. After consideration of risks, benefits and other options for treatment, the patient has consented to  Procedure(s): REMOVAL OF BILATERAL TISSUE EXPANDER AND PLACEMENT OF BILATERAL IMPLANT (Bilateral) REMOVAL PORT-A-CATH LEFT CHEST  (Left) POSSIBLE LIPOSUCTION WITH LIPOFILLING (Bilateral) as a surgical intervention .  The patient's history has been reviewed, patient examined, no change in status, stable for surgery.  I have reviewed the patient's chart and labs.  Questions were answered to the patient's satisfaction.     SANGER,Nivan Melendrez

## 2013-10-10 ENCOUNTER — Encounter (HOSPITAL_BASED_OUTPATIENT_CLINIC_OR_DEPARTMENT_OTHER): Payer: Self-pay | Admitting: Plastic Surgery

## 2013-10-31 ENCOUNTER — Encounter (INDEPENDENT_AMBULATORY_CARE_PROVIDER_SITE_OTHER): Payer: Medicare Other | Admitting: Ophthalmology

## 2013-10-31 DIAGNOSIS — I10 Essential (primary) hypertension: Secondary | ICD-10-CM

## 2013-10-31 DIAGNOSIS — H2510 Age-related nuclear cataract, unspecified eye: Secondary | ICD-10-CM

## 2013-10-31 DIAGNOSIS — E1165 Type 2 diabetes mellitus with hyperglycemia: Secondary | ICD-10-CM

## 2013-10-31 DIAGNOSIS — E11319 Type 2 diabetes mellitus with unspecified diabetic retinopathy without macular edema: Secondary | ICD-10-CM

## 2013-10-31 DIAGNOSIS — H35039 Hypertensive retinopathy, unspecified eye: Secondary | ICD-10-CM

## 2013-10-31 DIAGNOSIS — E1139 Type 2 diabetes mellitus with other diabetic ophthalmic complication: Secondary | ICD-10-CM

## 2013-10-31 DIAGNOSIS — H43819 Vitreous degeneration, unspecified eye: Secondary | ICD-10-CM

## 2014-07-17 ENCOUNTER — Other Ambulatory Visit: Payer: Self-pay

## 2015-03-29 ENCOUNTER — Other Ambulatory Visit: Payer: Self-pay

## 2018-04-03 ENCOUNTER — Telehealth: Payer: Self-pay | Admitting: Cardiology

## 2018-04-03 ENCOUNTER — Other Ambulatory Visit: Payer: Self-pay

## 2018-04-03 ENCOUNTER — Encounter: Payer: Self-pay | Admitting: *Deleted

## 2018-04-03 ENCOUNTER — Encounter: Payer: Self-pay | Admitting: Cardiology

## 2018-04-03 ENCOUNTER — Ambulatory Visit (INDEPENDENT_AMBULATORY_CARE_PROVIDER_SITE_OTHER): Payer: 59 | Admitting: Cardiology

## 2018-04-03 VITALS — BP 133/91 | HR 104 | Ht 64.0 in | Wt 223.0 lb

## 2018-04-03 DIAGNOSIS — R0602 Shortness of breath: Secondary | ICD-10-CM

## 2018-04-03 DIAGNOSIS — I509 Heart failure, unspecified: Secondary | ICD-10-CM | POA: Diagnosis not present

## 2018-04-03 MED ORDER — FUROSEMIDE 40 MG PO TABS: 40.0000 mg | ORAL_TABLET | ORAL | 1 refills | Status: DC | PRN

## 2018-04-03 NOTE — Patient Instructions (Signed)
Medication Instructions:  Your physician has recommended you make the following change in your medication:    Lasix 40 mg as needed for swelling   Please continue all other medications as prescribed   Labwork: NONE  Testing/Procedures: Your physician has requested that you have an echocardiogram. Echocardiography is a painless test that uses sound waves to create images of your heart. It provides your doctor with information about the size and shape of your heart and how well your heart's chambers and valves are working. This procedure takes approximately one hour. There are no restrictions for this procedure.  Follow-Up: Your physician recommends that you schedule a follow-up appointment PENDING TEST RESULTS   Any Other Special Instructions Will Be Listed Below (If Applicable).  If you need a refill on your cardiac medications before your next appointment, please call your pharmacy.
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
## 2018-04-03 NOTE — Telephone Encounter (Signed)
Pre-cert Verification for the following procedure   Echo scheduled for 04-25-2018

## 2018-04-13 ENCOUNTER — Encounter: Payer: Self-pay | Admitting: Cardiology

## 2018-04-25 ENCOUNTER — Other Ambulatory Visit: Payer: Self-pay

## 2018-04-25 ENCOUNTER — Ambulatory Visit (INDEPENDENT_AMBULATORY_CARE_PROVIDER_SITE_OTHER): Payer: 59

## 2018-04-25 DIAGNOSIS — R0602 Shortness of breath: Secondary | ICD-10-CM | POA: Diagnosis not present

## 2018-05-01 ENCOUNTER — Telehealth: Payer: Self-pay | Admitting: Cardiology

## 2018-05-01 ENCOUNTER — Telehealth: Payer: Self-pay | Admitting: *Deleted

## 2018-05-01 DIAGNOSIS — R0602 Shortness of breath: Secondary | ICD-10-CM

## 2018-05-01 NOTE — Telephone Encounter (Signed)
-----   Message from Arnoldo Lenis, MD sent at 04/29/2018  1:15 PM EDT ----- Difficult to get clear pictures on her echo, can we arrange a limited study with echocontrast at [facility] without additional charge for SOB   Zandra Abts MD

## 2018-05-01 NOTE — Telephone Encounter (Signed)
Pre-cert Verification for the following procedure   limited echo with contrast scheduled for 05-06-2018 at [facility]

## 2018-05-01 NOTE — Telephone Encounter (Signed)
Pt aware and agreeable to echo w/contrast - will place orders and forward to schedulers

## 2018-05-06 ENCOUNTER — Ambulatory Visit (HOSPITAL_COMMUNITY): Admission: RE | Admit: 2018-05-06 | Payer: 59 | Source: Ambulatory Visit

## 2018-06-07 ENCOUNTER — Other Ambulatory Visit: Payer: Self-pay | Admitting: Cardiology

## 2018-07-02 ENCOUNTER — Encounter: Payer: Self-pay | Admitting: *Deleted

## 2019-07-24 ENCOUNTER — Other Ambulatory Visit: Payer: Self-pay

## 2019-07-24 DIAGNOSIS — Z20822 Contact with and (suspected) exposure to covid-19: Secondary | ICD-10-CM

## 2019-07-26 LAB — NOVEL CORONAVIRUS, NAA: SARS-CoV-2, NAA: NOT DETECTED

## 2019-08-05 ENCOUNTER — Other Ambulatory Visit: Payer: Self-pay

## 2019-08-05 DIAGNOSIS — Z20822 Contact with and (suspected) exposure to covid-19: Secondary | ICD-10-CM

## 2019-08-07 LAB — NOVEL CORONAVIRUS, NAA: SARS-CoV-2, NAA: NOT DETECTED

## 2019-08-26 ENCOUNTER — Other Ambulatory Visit: Payer: Self-pay

## 2019-08-26 DIAGNOSIS — Z20822 Contact with and (suspected) exposure to covid-19: Secondary | ICD-10-CM

## 2019-08-27 LAB — NOVEL CORONAVIRUS, NAA: SARS-CoV-2, NAA: NOT DETECTED

## 2019-10-17 ENCOUNTER — Telehealth (INDEPENDENT_AMBULATORY_CARE_PROVIDER_SITE_OTHER): Payer: Commercial Managed Care - PPO | Admitting: Cardiology

## 2019-10-17 ENCOUNTER — Encounter: Payer: Self-pay | Admitting: Cardiology

## 2019-10-17 VITALS — BP 156/109 | HR 114 | Ht 65.0 in | Wt 220.0 lb

## 2019-10-17 DIAGNOSIS — I5022 Chronic systolic (congestive) heart failure: Secondary | ICD-10-CM | POA: Diagnosis not present

## 2019-10-17 DIAGNOSIS — R000 Tachycardia, unspecified: Secondary | ICD-10-CM

## 2019-10-17 DIAGNOSIS — I10 Essential (primary) hypertension: Secondary | ICD-10-CM

## 2019-10-17 MED ORDER — CARVEDILOL 3.125 MG PO TABS: 3.1250 mg | ORAL_TABLET | Freq: Two times a day (BID) | ORAL | 3 refills | Status: DC

## 2019-10-17 MED ORDER — LOSARTAN POTASSIUM 50 MG PO TABS: 50.0000 mg | ORAL_TABLET | Freq: Every day | ORAL | 3 refills | Status: AC
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
## 2019-10-17 NOTE — Addendum Note (Signed)
Addended by: Debbora Lacrosse R on: 10/17/2019 02:24 PM   Modules accepted: Orders

## 2019-10-17 NOTE — Patient Instructions (Signed)
Medication Instructions:  START LOSARTAN 50 MG DAILY   START COREG 3.125 MG TWO TIMES DAILY   Labwork: NONE  Testing/Procedures: NONE  Follow-Up: Your physician recommends that you schedule a follow-up appointment in: 6 WEEKS    Any Other Special Instructions Will Be Listed Below (If Applicable).     If you need a refill on your cardiac medications before your next appointment, please call your pharmacy.

## 2019-11-21 ENCOUNTER — Telehealth: Payer: Commercial Managed Care - PPO | Admitting: Cardiology

## 2019-11-24 ENCOUNTER — Encounter: Payer: Self-pay | Admitting: Cardiology

## 2019-11-24 ENCOUNTER — Telehealth (INDEPENDENT_AMBULATORY_CARE_PROVIDER_SITE_OTHER): Payer: Commercial Managed Care - PPO | Admitting: Cardiology

## 2019-11-24 VITALS — BP 138/90 | HR 116 | Ht 64.0 in | Wt 222.0 lb

## 2019-11-24 DIAGNOSIS — I110 Hypertensive heart disease with heart failure: Secondary | ICD-10-CM

## 2019-11-24 DIAGNOSIS — R000 Tachycardia, unspecified: Secondary | ICD-10-CM

## 2019-11-24 DIAGNOSIS — I5022 Chronic systolic (congestive) heart failure: Secondary | ICD-10-CM | POA: Diagnosis not present

## 2019-11-24 DIAGNOSIS — I10 Essential (primary) hypertension: Secondary | ICD-10-CM

## 2019-11-24 MED ORDER — CARVEDILOL 6.25 MG PO TABS: 6.2500 mg | ORAL_TABLET | Freq: Two times a day (BID) | ORAL | 3 refills | Status: DC

## 2019-11-24 NOTE — Patient Instructions (Addendum)
Medication Instructions:   Your physician has recommended you make the following change in your medication:   Increase carvedilol to 6.25 mg by mouth twice daily. You may take (2) of your 3.125 mg tablets twice daily until they are finished.  Continue other medications the same  Labwork:  NONE  Testing/Procedures: Your physician has requested that you have an echocardiogram. Echocardiography is a painless test that uses sound waves to create images of your heart. It provides your doctor with information about the size and shape of your heart and how well your heart's chambers and valves are working. This procedure takes approximately one hour. There are no restrictions for this procedure. Your physician has requested that you have an EKG done at our [place] office on the day you have your echocardiogram done. This will be a nurse visit.  Follow-Up:  Your physician recommends that you schedule a follow-up appointment in: 1 month (office or virtual)  Any Other Special Instructions Will Be Listed Below (If Applicable).  If you need a refill on your cardiac medications before your next appointment, please call your pharmacy.
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
## 2019-11-26 ENCOUNTER — Telehealth: Payer: Commercial Managed Care - PPO | Admitting: Cardiology

## 2019-12-01 ENCOUNTER — Ambulatory Visit (HOSPITAL_COMMUNITY)
Admission: RE | Admit: 2019-12-01 | Discharge: 2019-12-01 | Disposition: A | Discharge status: Home / Self Care | Payer: Commercial Managed Care - PPO | Source: Ambulatory Visit | Attending: Cardiology | Admitting: Cardiology

## 2019-12-01 ENCOUNTER — Other Ambulatory Visit: Payer: Self-pay

## 2019-12-01 ENCOUNTER — Ambulatory Visit: Payer: Commercial Managed Care - PPO

## 2019-12-01 DIAGNOSIS — I5022 Chronic systolic (congestive) heart failure: Secondary | ICD-10-CM | POA: Diagnosis present

## 2019-12-01 MED ORDER — PERFLUTREN LIPID MICROSPHERE
1.0000 mL | INTRAVENOUS | Status: AC | PRN
  Administered 2019-12-01: 1 mL via INTRAVENOUS
  Filled 2019-12-01: qty 10

## 2019-12-01 NOTE — Progress Notes (Signed)
*  PRELIMINARY RESULTS* Echocardiogram 2D Echocardiogram with definity has been performed.  Leavy Cella 12/01/2019, 3:23 PM

## 2019-12-23 ENCOUNTER — Encounter: Payer: Self-pay | Admitting: Cardiology

## 2019-12-23 ENCOUNTER — Telehealth: Payer: Commercial Managed Care - PPO | Admitting: Cardiology

## 2019-12-23 NOTE — Progress Notes (Signed)
Opened in error, not able to contact patient

## 2020-01-09 ENCOUNTER — Ambulatory Visit: Payer: Commercial Managed Care - PPO | Attending: Internal Medicine

## 2020-01-09 DIAGNOSIS — Z23 Encounter for immunization: Secondary | ICD-10-CM

## 2020-01-09 NOTE — Progress Notes (Signed)
   Covid-19 Vaccination Clinic  Name:  Rita Austin    MRN: [id number] DOB: 31-Mar-1971  01/09/2020  Ms. Rita Austin was observed post Covid-19 immunization for 15 minutes without incident. She was provided with Vaccine Information Sheet and instruction to access the V-Safe system.   Ms. Rita Austin was instructed to call 911 with any severe reactions post vaccine: Marland Kitchen Difficulty breathing  . Swelling of face and throat  . A fast heartbeat  . A bad rash all over body  . Dizziness and weakness   Immunizations Administered    Name Date Dose VIS Date Route   Moderna COVID-19 Vaccine 01/09/2020 11:51 AM 0.5 mL 09/02/2019 Intramuscular   Manufacturer: Moderna   Lot: [id number]   [id number]

## 2020-02-03 ENCOUNTER — Ambulatory Visit: Payer: Commercial Managed Care - PPO | Admitting: Family Medicine

## 2020-02-11 ENCOUNTER — Ambulatory Visit: Payer: 59 | Admitting: Family Medicine

## 2020-05-25 ENCOUNTER — Ambulatory Visit: Payer: Medicare Other | Admitting: "Endocrinology

## 2020-07-06 ENCOUNTER — Ambulatory Visit: Payer: Commercial Managed Care - PPO | Admitting: Nutrition

## 2020-09-07 ENCOUNTER — Encounter (INDEPENDENT_AMBULATORY_CARE_PROVIDER_SITE_OTHER): Payer: Self-pay | Admitting: *Deleted

## 2020-12-09 ENCOUNTER — Encounter (INDEPENDENT_AMBULATORY_CARE_PROVIDER_SITE_OTHER): Payer: Self-pay | Admitting: Gastroenterology

## 2020-12-09 ENCOUNTER — Telehealth (INDEPENDENT_AMBULATORY_CARE_PROVIDER_SITE_OTHER): Payer: Self-pay

## 2020-12-09 ENCOUNTER — Ambulatory Visit (INDEPENDENT_AMBULATORY_CARE_PROVIDER_SITE_OTHER): Payer: Commercial Managed Care - PPO | Admitting: Gastroenterology

## 2020-12-09 NOTE — Telephone Encounter (Signed)
Noted  

## 2020-12-09 NOTE — Telephone Encounter (Signed)
Patient no showed for her appointment with [facility] with Dr. Maylon Peppers.

## 2022-01-09 ENCOUNTER — Emergency Department (HOSPITAL_COMMUNITY): Payer: BLUE CROSS/BLUE SHIELD

## 2022-01-09 ENCOUNTER — Observation Stay (HOSPITAL_COMMUNITY)
Admission: EM | Admit: 2022-01-09 | Discharge: 2022-01-11 | Disposition: A | Discharge status: Home / Self Care | Payer: BLUE CROSS/BLUE SHIELD | Attending: Family Medicine | Admitting: Family Medicine

## 2022-01-09 ENCOUNTER — Other Ambulatory Visit: Payer: Self-pay

## 2022-01-09 ENCOUNTER — Encounter (HOSPITAL_COMMUNITY): Payer: Self-pay | Admitting: *Deleted

## 2022-01-09 DIAGNOSIS — I4581 Long QT syndrome: Secondary | ICD-10-CM | POA: Diagnosis not present

## 2022-01-09 DIAGNOSIS — Z853 Personal history of malignant neoplasm of breast: Secondary | ICD-10-CM | POA: Diagnosis not present

## 2022-01-09 DIAGNOSIS — Z7984 Long term (current) use of oral hypoglycemic drugs: Secondary | ICD-10-CM | POA: Insufficient documentation

## 2022-01-09 DIAGNOSIS — E782 Mixed hyperlipidemia: Secondary | ICD-10-CM | POA: Insufficient documentation

## 2022-01-09 DIAGNOSIS — N179 Acute kidney failure, unspecified: Secondary | ICD-10-CM | POA: Diagnosis not present

## 2022-01-09 DIAGNOSIS — E876 Hypokalemia: Secondary | ICD-10-CM | POA: Insufficient documentation

## 2022-01-09 DIAGNOSIS — R9431 Abnormal electrocardiogram [ECG] [EKG]: Secondary | ICD-10-CM

## 2022-01-09 DIAGNOSIS — Z20822 Contact with and (suspected) exposure to covid-19: Secondary | ICD-10-CM | POA: Diagnosis not present

## 2022-01-09 DIAGNOSIS — I5022 Chronic systolic (congestive) heart failure: Secondary | ICD-10-CM | POA: Insufficient documentation

## 2022-01-09 DIAGNOSIS — Z79899 Other long term (current) drug therapy: Secondary | ICD-10-CM | POA: Diagnosis not present

## 2022-01-09 DIAGNOSIS — I10 Essential (primary) hypertension: Secondary | ICD-10-CM

## 2022-01-09 DIAGNOSIS — R609 Edema, unspecified: Secondary | ICD-10-CM | POA: Insufficient documentation

## 2022-01-09 DIAGNOSIS — R748 Abnormal levels of other serum enzymes: Secondary | ICD-10-CM | POA: Diagnosis not present

## 2022-01-09 DIAGNOSIS — R0602 Shortness of breath: Principal | ICD-10-CM

## 2022-01-09 DIAGNOSIS — I110 Hypertensive heart disease with heart failure: Secondary | ICD-10-CM | POA: Diagnosis not present

## 2022-01-09 DIAGNOSIS — R778 Other specified abnormalities of plasma proteins: Secondary | ICD-10-CM

## 2022-01-09 DIAGNOSIS — E1165 Type 2 diabetes mellitus with hyperglycemia: Secondary | ICD-10-CM | POA: Insufficient documentation

## 2022-01-09 LAB — BASIC METABOLIC PANEL
Anion gap: 12 (ref 5–15)
BUN: 27 mg/dL — ABNORMAL HIGH (ref 6–20)
CO2: 28 mmol/L (ref 22–32)
Calcium: 9.3 mg/dL (ref 8.9–10.3)
Chloride: 95 mmol/L — ABNORMAL LOW (ref 98–111)
Creatinine, Ser: 2.7 mg/dL — ABNORMAL HIGH (ref 0.44–1.00)
GFR, Estimated: 21 mL/min — ABNORMAL LOW (ref >60)
Glucose, Bld: 239 mg/dL — ABNORMAL HIGH (ref 70–99)
Potassium: 3.1 mmol/L — ABNORMAL LOW (ref 3.5–5.1)
Sodium: 135 mmol/L (ref 135–145)

## 2022-01-09 LAB — CBC
HCT: 37.7 % (ref 36.0–46.0)
Hemoglobin: 11.8 g/dL — ABNORMAL LOW (ref 12.0–15.0)
MCH: 26.1 pg (ref 26.0–34.0)
MCHC: 31.3 g/dL (ref 30.0–36.0)
MCV: 83.4 fL (ref 80.0–100.0)
Platelets: 266 10*3/uL (ref 150–400)
RBC: 4.52 MIL/uL (ref 3.87–5.11)
RDW: 13.3 % (ref 11.5–15.5)
WBC: 7.6 10*3/uL (ref 4.0–10.5)
nRBC: 0 % (ref 0.0–0.2)

## 2022-01-09 NOTE — ED Triage Notes (Signed)
Pt c/o chest pain under bilateral breasts today; pt c/o sob and dizziness ?

## 2022-01-10 DIAGNOSIS — Z853 Personal history of malignant neoplasm of breast: Secondary | ICD-10-CM

## 2022-01-10 DIAGNOSIS — N179 Acute kidney failure, unspecified: Secondary | ICD-10-CM | POA: Diagnosis present

## 2022-01-10 DIAGNOSIS — I5022 Chronic systolic (congestive) heart failure: Secondary | ICD-10-CM

## 2022-01-10 DIAGNOSIS — R778 Other specified abnormalities of plasma proteins: Secondary | ICD-10-CM | POA: Diagnosis not present

## 2022-01-10 DIAGNOSIS — E782 Mixed hyperlipidemia: Secondary | ICD-10-CM

## 2022-01-10 DIAGNOSIS — I10 Essential (primary) hypertension: Secondary | ICD-10-CM | POA: Diagnosis not present

## 2022-01-10 DIAGNOSIS — E876 Hypokalemia: Secondary | ICD-10-CM

## 2022-01-10 DIAGNOSIS — E1165 Type 2 diabetes mellitus with hyperglycemia: Secondary | ICD-10-CM

## 2022-01-10 DIAGNOSIS — R9431 Abnormal electrocardiogram [ECG] [EKG]: Secondary | ICD-10-CM

## 2022-01-10 LAB — URINALYSIS, ROUTINE W REFLEX MICROSCOPIC
Bilirubin Urine: NEGATIVE
Glucose, UA: NEGATIVE mg/dL
Hgb urine dipstick: NEGATIVE
Ketones, ur: NEGATIVE mg/dL
Leukocytes,Ua: NEGATIVE
Nitrite: NEGATIVE
Protein, ur: NEGATIVE mg/dL
Specific Gravity, Urine: 1.005 (ref 1.005–1.030)
pH: 5 (ref 5.0–8.0)

## 2022-01-10 LAB — RENAL FUNCTION PANEL
Albumin: 3.5 g/dL (ref 3.5–5.0)
Anion gap: 11 (ref 5–15)
BUN: 26 mg/dL — ABNORMAL HIGH (ref 6–20)
CO2: 28 mmol/L (ref 22–32)
Calcium: 8.7 mg/dL — ABNORMAL LOW (ref 8.9–10.3)
Chloride: 97 mmol/L — ABNORMAL LOW (ref 98–111)
Creatinine, Ser: 2.15 mg/dL — ABNORMAL HIGH (ref 0.44–1.00)
GFR, Estimated: 27 mL/min — ABNORMAL LOW (ref >60)
Glucose, Bld: 184 mg/dL — ABNORMAL HIGH (ref 70–99)
Phosphorus: 4.4 mg/dL (ref 2.5–4.6)
Potassium: 2.7 mmol/L — CL (ref 3.5–5.1)
Sodium: 136 mmol/L (ref 135–145)

## 2022-01-10 LAB — COMPREHENSIVE METABOLIC PANEL
ALT: 17 U/L (ref 0–44)
AST: 19 U/L (ref 15–41)
Albumin: 3.5 g/dL (ref 3.5–5.0)
Alkaline Phosphatase: 60 U/L (ref 38–126)
Anion gap: 11 (ref 5–15)
BUN: 26 mg/dL — ABNORMAL HIGH (ref 6–20)
CO2: 28 mmol/L (ref 22–32)
Calcium: 8.8 mg/dL — ABNORMAL LOW (ref 8.9–10.3)
Chloride: 97 mmol/L — ABNORMAL LOW (ref 98–111)
Creatinine, Ser: 2.09 mg/dL — ABNORMAL HIGH (ref 0.44–1.00)
GFR, Estimated: 28 mL/min — ABNORMAL LOW (ref >60)
Glucose, Bld: 185 mg/dL — ABNORMAL HIGH (ref 70–99)
Potassium: 2.7 mmol/L — CL (ref 3.5–5.1)
Sodium: 136 mmol/L (ref 135–145)
Total Bilirubin: 0.6 mg/dL (ref 0.3–1.2)
Total Protein: 6.8 g/dL (ref 6.5–8.1)

## 2022-01-10 LAB — BRAIN NATRIURETIC PEPTIDE: B Natriuretic Peptide: 43 pg/mL (ref 0.0–100.0)

## 2022-01-10 LAB — GLUCOSE, CAPILLARY
Glucose-Capillary: 141 mg/dL — ABNORMAL HIGH (ref 70–99)
Glucose-Capillary: 246 mg/dL — ABNORMAL HIGH (ref 70–99)
Glucose-Capillary: 263 mg/dL — ABNORMAL HIGH (ref 70–99)
Glucose-Capillary: 321 mg/dL — ABNORMAL HIGH (ref 70–99)

## 2022-01-10 LAB — APTT: aPTT: 25 seconds (ref 24–36)

## 2022-01-10 LAB — TROPONIN I (HIGH SENSITIVITY)
Troponin I (High Sensitivity): 19 ng/L — ABNORMAL HIGH (ref <18)
Troponin I (High Sensitivity): 22 ng/L — ABNORMAL HIGH (ref <18)
Troponin I (High Sensitivity): 27 ng/L — ABNORMAL HIGH (ref <18)
Troponin I (High Sensitivity): 25 ng/L — ABNORMAL HIGH (ref <18)

## 2022-01-10 LAB — PHOSPHORUS: Phosphorus: 4.1 mg/dL (ref 2.5–4.6)

## 2022-01-10 LAB — RESP PANEL BY RT-PCR (FLU A&B, COVID) ARPGX2
Influenza A by PCR: NEGATIVE
Influenza B by PCR: NEGATIVE
SARS Coronavirus 2 by RT PCR: NEGATIVE

## 2022-01-10 LAB — HEMOGLOBIN A1C
Hgb A1c MFr Bld: 11.5 % — ABNORMAL HIGH (ref 4.8–5.6)
Mean Plasma Glucose: 283.35 mg/dL

## 2022-01-10 LAB — CBG MONITORING, ED
Glucose-Capillary: 106 mg/dL — ABNORMAL HIGH (ref 70–99)
Glucose-Capillary: 134 mg/dL — ABNORMAL HIGH (ref 70–99)

## 2022-01-10 LAB — MAGNESIUM: Magnesium: 1.8 mg/dL (ref 1.7–2.4)

## 2022-01-10 LAB — D-DIMER, QUANTITATIVE: D-Dimer, Quant: 0.29 ug/mL-FEU (ref 0.00–0.50)

## 2022-01-10 LAB — HIV ANTIBODY (ROUTINE TESTING W REFLEX): HIV Screen 4th Generation wRfx: NONREACTIVE

## 2022-01-10 LAB — POC URINE PREG, ED: Preg Test, Ur: NEGATIVE

## 2022-01-10 MED ORDER — AMLODIPINE BESYLATE 5 MG PO TABS
10.0000 mg | ORAL_TABLET | Freq: Every day | ORAL | Status: DC
  Administered 2022-01-10 – 2022-01-11 (×2): 10 mg via ORAL
  Filled 2022-01-10 (×2): qty 2

## 2022-01-10 MED ORDER — ENOXAPARIN SODIUM 40 MG/0.4ML IJ SOSY
40.0000 mg | PREFILLED_SYRINGE | INTRAMUSCULAR | Status: DC
  Administered 2022-01-11: 40 mg via SUBCUTANEOUS
  Filled 2022-01-10: qty 0.4

## 2022-01-10 MED ORDER — INSULIN GLARGINE-YFGN 100 UNIT/ML ~~LOC~~ SOLN
10.0000 [IU] | Freq: Every day | SUBCUTANEOUS | Status: DC
  Administered 2022-01-10: 10 [IU] via SUBCUTANEOUS
  Filled 2022-01-10 (×2): qty 0.1

## 2022-01-10 MED ORDER — INSULIN ASPART 100 UNIT/ML IJ SOLN
3.0000 [IU] | Freq: Three times a day (TID) | INTRAMUSCULAR | Status: DC
  Administered 2022-01-10 (×3): 3 [IU] via SUBCUTANEOUS

## 2022-01-10 MED ORDER — CARVEDILOL 12.5 MG PO TABS
12.5000 mg | ORAL_TABLET | Freq: Two times a day (BID) | ORAL | Status: DC
  Administered 2022-01-10 – 2022-01-11 (×2): 12.5 mg via ORAL
  Filled 2022-01-10 (×2): qty 1

## 2022-01-10 MED ORDER — SODIUM CHLORIDE 0.9 % IV BOLUS
500.0000 mL | Freq: Once | INTRAVENOUS | Status: AC
Start: 2022-01-10 — End: 2022-01-10
  Administered 2022-01-10: 500 mL via INTRAVENOUS

## 2022-01-10 MED ORDER — INSULIN ASPART 100 UNIT/ML IJ SOLN
0.0000 [IU] | Freq: Every day | INTRAMUSCULAR | Status: DC
  Administered 2022-01-10: 4 [IU] via SUBCUTANEOUS

## 2022-01-10 MED ORDER — INSULIN ASPART 100 UNIT/ML IJ SOLN
0.0000 [IU] | Freq: Three times a day (TID) | INTRAMUSCULAR | Status: DC
  Administered 2022-01-10: 3 [IU] via SUBCUTANEOUS
  Administered 2022-01-10: 5 [IU] via SUBCUTANEOUS
  Administered 2022-01-10: 1 [IU] via SUBCUTANEOUS
  Administered 2022-01-11: 7 [IU] via SUBCUTANEOUS

## 2022-01-10 MED ORDER — SODIUM CHLORIDE 0.9 % IV SOLN
INTRAVENOUS | Status: AC
Start: 2022-01-10 — End: 2022-01-10

## 2022-01-10 MED ORDER — HYDRALAZINE HCL 20 MG/ML IJ SOLN: 10.0000 mg | Freq: Four times a day (QID) | INTRAMUSCULAR | Status: DC | PRN

## 2022-01-10 MED ORDER — ENOXAPARIN SODIUM 30 MG/0.3ML IJ SOSY
30.0000 mg | PREFILLED_SYRINGE | INTRAMUSCULAR | Status: DC
  Administered 2022-01-10: 30 mg via SUBCUTANEOUS
  Filled 2022-01-10: qty 0.3

## 2022-01-10 MED ORDER — SODIUM CHLORIDE 0.9 % IV SOLN: INTRAVENOUS | Status: DC

## 2022-01-10 MED ORDER — POTASSIUM CHLORIDE CRYS ER 20 MEQ PO TBCR
40.0000 meq | EXTENDED_RELEASE_TABLET | Freq: Once | ORAL | Status: AC
  Administered 2022-01-10: 40 meq via ORAL
  Filled 2022-01-10: qty 2

## 2022-01-10 NOTE — ED Notes (Signed)
Patient BG 106, given graham crackers and peanut butter. ?

## 2022-01-10 NOTE — ED Notes (Signed)
Patient ambulated to restroom without difficulty. Attempting to give urine sample.  ?
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
## 2022-01-10 NOTE — Progress Notes (Signed)
Patient seen and evaluated, chart reviewed, please see EMR for updated orders. Please see full H&P dictated by admitting physician Dr Josephine Cables for same date of service.  ? ? ?Brief Summary:- ?51 y.o. female with medical history significant of breast cancer in remission, CHF, T2DM, hypertension, hyperlipidemia admitted with AKI and electrolyte abnormalities after having the blood pressure medications adjusted recently ? ?A/p ?1)AKI----acute kidney injury   ?-Admission creatinine was 2.7 ?-Creatinine improving with hydration down to 2.0 at this time ?--As per care everywhere records from [facility] creatinine was 1.1 on 10/04/2021 ?, renally adjust medications, avoid nephrotoxic agents / dehydration  / hypotension ?-Continue to hold Benicar, losartan metformin and Lasix ? ?2)-Hypokalemia--- replace and recheck, magnesium WNL ? ?3)DM2- A1c was 11.2 on 10/04/2021, A1c today is 11.5 reflecting uncontrolled DM with persistent hyperglycemia ?-Hold metformin due to AKI as above ?-Patient admits to inconsistent compliance with insulin at home due to financial constraints/copays ?-- Diabetic educator consult/input appreciated ?Use Novolog/Humalog Sliding scale insulin with Accu-Cheks/Fingersticks as ordered  ? ?4)Class II Obesity- ?-Low calorie diet, portion control and increase physical activity discussed with patient ?-Body mass index is 38.48 kg/m?. ? ?5)HTN-avoid ACEI/ARB/ARNI due to renal concerns ?-BP is not at goal, given amlodipine and Coreg ?.  IV hydralazine as needed elevated BP ? ?6)HFrEF/chronic systolic dysfunction CHF--Echo from March 2021 with EF of 45% with global hypokinesis of the left ventricle ?-No acute exacerbation at this time ?-Restart Coreg ?avoid ACEI/ARB/ARNI due to renal concerns ?-- Consider isosorbide/hydralazine combo in lieu of ACE/ARB ? ? ?-Total Care time 46 minutes ? ?Patient seen and evaluated, chart reviewed, please see EMR for updated orders. Please see full H&P dictated by admitting  physician Dr Josephine Cables for same date of service.  ? ? ?Roxan Hockey, MD ? ? ?

## 2022-01-10 NOTE — ED Notes (Signed)
Pt repositioned and provided with additional blanket. MD at bedside and updated pt on plan. ?

## 2022-01-10 NOTE — TOC Progression Note (Signed)
?  Transition of Care (TOC) Screening Note ? ? ?Patient Details  ?Name: Rita Austin ?Date of Birth: 03-14-1971 ? ? ?Transition of Care (TOC) CM/SW Contact:    ?Shade Flood, LCSW ?Phone Number: ?01/10/2022, 11:29 AM ? ? ? ?Transition of Care Department [facility]) has reviewed patient and no TOC needs have been identified at this time. We will continue to monitor patient advancement through interdisciplinary progression rounds. If new patient transition needs arise, please place a TOC consult. ? ? ?

## 2022-01-10 NOTE — ED Provider Notes (Signed)
?[place] ?Provider Note ? ? ?CSN: [id number] ?Arrival date & time: 01/09/22  2007 ? ?  ? ?History ? ?Chief Complaint  ?Patient presents with  ? Shortness of Breath  ? ? ?Rita Austin is a 51 y.o. female. ? ?HPI ? ?  ? ?This is a 51 year old female who presents with shortness of breath.  Patient reports several day history of worsening dyspnea on exertion and orthopnea.  She has not had any cough or fevers.  She has had some discomfort on the right side of her chest.  It is nonradiating.  She is currently not having any chest pain.  Patient states that she has noted some lower extremity swelling.  She sometimes takes an additional dose of Lasix for this.  This is not abnormal.  No history of PE.  She has a history of breast cancer but is currently in remission.  Denies any recent changes in medication.  Denies any significant NSAID use. ? ?Home Medications ?Prior to Admission medications   ?Medication Sig Start Date End Date Taking? Authorizing Provider  ?carvedilol (COREG) 6.25 MG tablet Take 1 tablet (6.25 mg total) by mouth 2 (two) times daily. 11/24/19   Arnoldo Lenis, MD  ?ferrous sulfate 325 (65 FE) MG tablet Take 65 mg by mouth daily with breakfast.    [provider]  ?furosemide (LASIX) 40 MG tablet TAKE 1 TABLET BY MOUTH ONCE DAILY AS NEEDED FOR  SWELLING 06/10/18   Arnoldo Lenis, MD  ?losartan (COZAAR) 50 MG tablet Take 1 tablet (50 mg total) by mouth daily. 10/17/19 01/15/20  Arnoldo Lenis, MD  ?metFORMIN (GLUCOPHAGE) 1000 MG tablet Take 1,000 mg by mouth 2 (two) times daily with a meal.    [provider]  ?simvastatin (ZOCOR) 20 MG tablet Take 20 mg by mouth every morning.    [provider]  ?   ? ?Allergies    ?Ace inhibitors   ? ?Review of Systems   ?Review of Systems  ?Constitutional:  Negative for fever.  ?Respiratory:  Positive for shortness of breath. Negative for cough.   ?Cardiovascular:  Positive for leg swelling. Negative for chest  pain.  ?Gastrointestinal:  Negative for abdominal pain, nausea and vomiting.  ?All other systems reviewed and are negative. ? ?Physical Exam ?Updated Vital Signs ?BP 123/75   Pulse 96   Temp 98.5 ?F (36.9 ?C) (Oral)   Resp 18   Ht 1.651 m ('5\' 5"'$ )   Wt 104.3 kg   LMP 09/20/2013   SpO2 100%   BMI 38.27 kg/m?  ?Physical Exam ?Vitals and nursing note reviewed.  ?Constitutional:   ?   Appearance: She is well-developed. She is obese. She is not ill-appearing.  ?HENT:  ?   Head: Normocephalic and atraumatic.  ?Eyes:  ?   Pupils: Pupils are equal, round, and reactive to light.  ?Cardiovascular:  ?   Rate and Rhythm: Normal rate and regular rhythm.  ?   Heart sounds: Normal heart sounds.  ?Pulmonary:  ?   Effort: Pulmonary effort is normal. No respiratory distress.  ?   Breath sounds: No wheezing.  ?Abdominal:  ?   General: Bowel sounds are normal.  ?   Palpations: Abdomen is soft.  ?Musculoskeletal:  ?   Cervical back: Neck supple.  ?   Right lower leg: Edema present.  ?   Left lower leg: Edema present.  ?   Comments: Trace bilateral lower extremity edema  ?Skin: ?  General: Skin is warm and dry.  ?Neurological:  ?   Mental Status: She is alert and oriented to person, place, and time.  ?Psychiatric:     ?   Mood and Affect: Mood normal.  ? ? ?ED Results / Procedures / Treatments   ?Labs ?(all labs ordered are listed, but only abnormal results are displayed) ?Labs Reviewed  ?BASIC METABOLIC PANEL - Abnormal; Notable for the following components:  ?    Result Value  ? Potassium 3.1 (*)   ? Chloride 95 (*)   ? Glucose, Bld 239 (*)   ? BUN 27 (*)   ? Creatinine, Ser 2.70 (*)   ? GFR, Estimated 21 (*)   ? All other components within normal limits  ?CBC - Abnormal; Notable for the following components:  ? Hemoglobin 11.8 (*)   ? All other components within normal limits  ?TROPONIN I (HIGH SENSITIVITY) - Abnormal; Notable for the following components:  ? Troponin I (High Sensitivity) 19 (*)   ? All other components within  normal limits  ?TROPONIN I (HIGH SENSITIVITY) - Abnormal; Notable for the following components:  ? Troponin I (High Sensitivity) 22 (*)   ? All other components within normal limits  ?RESP PANEL BY RT-PCR (FLU A&B, COVID) ARPGX2  ?BRAIN NATRIURETIC PEPTIDE  ?D-DIMER, QUANTITATIVE (NOT AT [facility])  ?URINALYSIS, ROUTINE W REFLEX MICROSCOPIC  ?POC URINE PREG, ED  ? ? ?EKG ?EKG Interpretation ? ?Date/Time:  Monday January 09 2022 21:00:55 EDT ?Ventricular Rate:  101 ?PR Interval:  196 ?QRS Duration: 90 ?QT Interval:  386 ?QTC Calculation: 500 ?R Axis:   -22 ?Text Interpretation: Sinus tachycardia Otherwise normal ECG When compared with ECG of 07-May-2013 14:38, Nonspecific T wave abnormality now evident in Lateral leads Confirmed by Thayer Jew [phone]) on 01/10/2022 12:05:38 AM ? ?Radiology ?DG Chest 2 View ? ?Result Date: 01/09/2022 ?CLINICAL DATA:  Shortness of breath. EXAM: CHEST - 2 VIEW COMPARISON:  Chest x-ray 05/07/2013. FINDINGS: The heart size and mediastinal contours are within normal limits. Both lungs are clear. The visualized skeletal structures are unremarkable. Right axillary surgical clips are present. IMPRESSION: No active cardiopulmonary disease. Electronically Signed   By: Ronney Asters M.D.   On: 01/09/2022 21:41   ? ?Procedures ?Procedures  ? ? ?Medications Ordered in ED ?Medications  ?sodium chloride 0.9 % bolus 500 mL (500 mLs Intravenous New Bag/Given 01/10/22 0230)  ? ? ?ED Course/ Medical Decision Making/ A&P ?  ?                        ?Medical Decision Making ?Amount and/or Complexity of Data Reviewed ?Labs: ordered. ?Radiology: ordered. ? ? ?This patient presents to the ED for concern of shortness of breath, this involves an extensive number of treatment options, and is a complaint that carries with it a high risk of complications and morbidity.  The differential diagnosis includes pneumonia, pneumothorax, PE, ACS, CHF ? ?MDM:   ? ?This is a 51 year old female with a history of breast cancer  and CHF who presents with shortness of breath.  She is nontoxic.  Initially slightly tachycardic.  O2 sats 99 to 100%.  She is in no respiratory distress.  She does not appear overtly volume overloaded.  Chest x-ray shows no evidence of pneumothorax, pneumonia, pulmonary edema.  EKG shows no evidence of acute ischemia or arrhythmia.  Troponin x2 slightly elevated but flat at 19 and 22.  BNP is normal.  Of note, patient's creatinine  is 2.7.  Based on chart review, this is an acute elevation from prior.  Her baseline is around 1.1.  Last lab work-up in January.  She denies any NSAID use or culprit medications.  She does use Lasix and occasionally will take an extra dose.  She reports good urine output.  Patient does not have any active cancer.  For this reason, screening D-dimer was sent to screen for blood clots as patient is not a candidate for CTA.  D-dimer is normal.  This is largely reassuring.  Discussed the work-up with patient.  Etiology of her shortness of breath and chest pain is unclear at this time; however, she does have significant AKI of unclear etiology.  We will plan for admission for further work-up of AKI.  Patient is agreeable to plan. ?(Labs, imaging) ? ?Labs: ?I Ordered, and personally interpreted labs.  The pertinent results include: Creatinine of 2.7, troponin 19 and 22 ? ?Imaging Studies ordered: ?I ordered imaging studies including chest x-ray negative ?I independently visualized and interpreted imaging. ?I agree with the radiologist interpretation ? ?Additional history obtained from husband at bedside.  External records from outside source obtained and reviewed including outside outpatient records ? ?Critical Interventions: ?500 cc IV fluid ? ?Consultations: ?I requested consultation with the NA,  and discussed lab and imaging findings as well as pertinent plan - they recommend: N/A ? ?Cardiac Monitoring: ?The patient was maintained on a cardiac monitor.  I personally viewed and interpreted  the cardiac monitored which showed an underlying rhythm of: Normal sinus rhythm ? ?Reevaluation: ?After the interventions noted above, I reevaluated the patient and found that they have :stayed the same ? ? ?C

## 2022-01-10 NOTE — ED Notes (Signed)
Asked pt for urine sample but pt did not have urge to go. Placed container for urine sample at bedside.  ?

## 2022-01-10 NOTE — Progress Notes (Signed)
Inpatient Diabetes Program Recommendations ? ?AACE/ADA: New Consensus Statement on Inpatient Glycemic Control  ? ?Target Ranges:  Prepandial:   less than 140 mg/dL ?     Peak postprandial:   less than 180 mg/dL (1-2 hours) ?     Critically ill patients:  140 - 180 mg/dL  ? ? ? Latest Reference Range & Units 01/10/22 05:03 01/10/22 07:55 01/10/22 08:44 01/10/22 11:56  ?Glucose-Capillary 70 - 99 mg/dL 106 (H) 134 (H) 141 (H) 246 (H)  ? ? Latest Reference Range & Units 01/10/22 06:10  ?Hemoglobin A1C 4.8 - 5.6 % 11.5 (H)  ? ?Review of Glycemic Control ? ?Diabetes history: DM2 ?Outpatient Diabetes medications: 70/30 20 units BID (sometimes takes increased dose if CBGs over 400 mg/dl), Metformin XR 1000 mg BID ?Current orders for Inpatient glycemic control: Semglee 10 units QHS, Novolog 0-9 units TID with meals, Novolog 0-5 units QHS, Novolog 3 units TID with meals ? ?Inpatient Diabetes Program Recommendations:   ? ?HbgA1C:  A1C 11.5% on 01/10/22 indicating an average glucose of 283 mg/dl over the past 2-3 months. A1C 11.2% on 10/04/21 (noted in [facility]). ? ?NOTE: In reviewing chart, noted patient seen Dr. Eliberto Ivory on 10/04/21 and per office note patient was suppose to be taking 70/30 20 units BID and Metformin 1000 mg BID but she was only taking Metformin 1000 mg daily. Patient was advised to take 70/30 20 units BID, Metformin 1000 mg BID, and start Soliqua 100 units-33 mcg/ml 50 units daily.  ? ?Spoke with patient over the phone about diabetes and home regimen for diabetes control. Patient reports being followed by PCP for diabetes management and notes that she had went to Endocrinology in [place] last year but notes that she has moved back to [place] and would like to find provider closer to home.  Patient state she takes Novolin 70/30 and Metformin XR for DM control. Patient reports that cost of copays and insurance coverage is a barrier for her with DM medications. She states she is getting Novolin 70/30 insulin pens at  [facility] for $45 per box of 5 insulin pens.  She notes that she is not consistently taking Novolin 70/30 and also notes that if glucose is over 400 mg/dl she takes more than the 20 units (would take 30-35 units if CBG over 400 mg/dl). Patient reports checking glucose 1-2 times on most days and notes that when she feels good she may not check glucose that much.  Inquired about using CGM and patient reports that in the past Endocrinology placed a sample CGM on her stomach for her to try but it came out the same day it was placed and she has not followed up with them about it since. Patient states that she does feel that a CGM would be beneficial and she did like the concept of using the CGM. Discussed taking DM medications consistently and how CGM could provide more information on glucose control to help improve DM control. Patient would like to try FreeStyle Libre2. Will ask attending provider for permission to give patient samples of FreeStyle Libre2 (Dr. Denton Brick provided permission to provide samples).  Informed patient that diabetes coordinator will come by [facility] in the morning to provide samples of the FreeStyle Libre2 and to help her apply.  Patient verbalized understanding of information discussed and reports no further questions at this time related to diabetes. Diabetes coordinator will plan to see patient in the morning and provide CGM sensor samples. ? ?Thanks, ?Barnie Alderman, RN, MSN,  CDE ?Diabetes Coordinator ?Inpatient Diabetes Program ?[phone] (Team Pager) ? ?

## 2022-01-11 DIAGNOSIS — N179 Acute kidney failure, unspecified: Secondary | ICD-10-CM | POA: Diagnosis not present

## 2022-01-11 DIAGNOSIS — I10 Essential (primary) hypertension: Secondary | ICD-10-CM | POA: Diagnosis not present

## 2022-01-11 DIAGNOSIS — R778 Other specified abnormalities of plasma proteins: Secondary | ICD-10-CM | POA: Diagnosis not present

## 2022-01-11 DIAGNOSIS — I5022 Chronic systolic (congestive) heart failure: Secondary | ICD-10-CM | POA: Diagnosis not present

## 2022-01-11 LAB — CBC
HCT: 32.9 % — ABNORMAL LOW (ref 36.0–46.0)
Hemoglobin: 10.2 g/dL — ABNORMAL LOW (ref 12.0–15.0)
MCH: 26 pg (ref 26.0–34.0)
MCHC: 31 g/dL (ref 30.0–36.0)
MCV: 83.7 fL (ref 80.0–100.0)
Platelets: 202 10*3/uL (ref 150–400)
RBC: 3.93 MIL/uL (ref 3.87–5.11)
RDW: 13 % (ref 11.5–15.5)
WBC: 4.5 10*3/uL (ref 4.0–10.5)
nRBC: 0 % (ref 0.0–0.2)

## 2022-01-11 LAB — COMPREHENSIVE METABOLIC PANEL
ALT: 15 U/L (ref 0–44)
AST: 15 U/L (ref 15–41)
Albumin: 3.2 g/dL — ABNORMAL LOW (ref 3.5–5.0)
Alkaline Phosphatase: 54 U/L (ref 38–126)
Anion gap: 9 (ref 5–15)
BUN: 20 mg/dL (ref 6–20)
CO2: 26 mmol/L (ref 22–32)
Calcium: 8.9 mg/dL (ref 8.9–10.3)
Chloride: 103 mmol/L (ref 98–111)
Creatinine, Ser: 1.3 mg/dL — ABNORMAL HIGH (ref 0.44–1.00)
GFR, Estimated: 49 mL/min — ABNORMAL LOW (ref >60)
Glucose, Bld: 294 mg/dL — ABNORMAL HIGH (ref 70–99)
Potassium: 3.5 mmol/L (ref 3.5–5.1)
Sodium: 138 mmol/L (ref 135–145)
Total Bilirubin: 0.4 mg/dL (ref 0.3–1.2)
Total Protein: 6.1 g/dL — ABNORMAL LOW (ref 6.5–8.1)

## 2022-01-11 LAB — GLUCOSE, CAPILLARY
Glucose-Capillary: 333 mg/dL — ABNORMAL HIGH (ref 70–99)
Glucose-Capillary: 312 mg/dL — ABNORMAL HIGH (ref 70–99)

## 2022-01-11 MED ORDER — AMLODIPINE BESYLATE 10 MG PO TABS: 10.0000 mg | ORAL_TABLET | Freq: Every day | ORAL | 1 refills | Status: AC

## 2022-01-11 MED ORDER — NOVOLIN 70/30 RELION (70-30) 100 UNIT/ML ~~LOC~~ SUSP: 20.0000 [IU] | Freq: Two times a day (BID) | SUBCUTANEOUS | 11 refills | Status: AC

## 2022-01-11 MED ORDER — FREESTYLE LIBRE 2 SENSOR MISC: 1.0000 [IU] | 3 refills | Status: AC

## 2022-01-11 MED ORDER — INSULIN ASPART PROT & ASPART (70-30 MIX) 100 UNIT/ML ~~LOC~~ SUSP
20.0000 [IU] | Freq: Two times a day (BID) | SUBCUTANEOUS | Status: DC
  Administered 2022-01-11: 20 [IU] via SUBCUTANEOUS
  Filled 2022-01-11: qty 10

## 2022-01-11 MED ORDER — SODIUM CHLORIDE 0.9 % IV SOLN: INTRAVENOUS | Status: DC

## 2022-01-11 MED ORDER — CARVEDILOL 12.5 MG PO TABS: 12.5000 mg | ORAL_TABLET | Freq: Two times a day (BID) | ORAL | 1 refills | Status: AC

## 2022-01-11 NOTE — Progress Notes (Addendum)
Inpatient Diabetes Program Recommendations ? ?AACE/ADA: New Consensus Statement on Inpatient Glycemic Control  ? ?Target Ranges:  Prepandial:   less than 140 mg/dL ?     Peak postprandial:   less than 180 mg/dL (1-2 hours) ?     Critically ill patients:  140 - 180 mg/dL  ? ? Latest Reference Range & Units 01/11/22 06:57  ?Glucose-Capillary 70 - 99 mg/dL 333 (H)  ? ? Latest Reference Range & Units 01/10/22 08:44 01/10/22 11:56 01/10/22 16:29 01/10/22 20:57  ?Glucose-Capillary 70 - 99 mg/dL 141 (H) 246 (H) 263 (H) 321 (H)  ? ?Review of Glycemic Control ? ?Diabetes history: DM2 ?Outpatient Diabetes medications: 70/30 20 units BID (sometimes takes increased dose if CBGs over 400 mg/dl), Metformin XR 1000 mg BID ?Current orders for Inpatient glycemic control: Semglee 10 units QHS, Novolog 0-9 units TID with meals, Novolog 0-5 units QHS, Novolog 3 units TID with meals ?  ?Inpatient Diabetes Program Recommendations:   ? ?Insulin: Please consider increasing Semglee to 20 units QHS (or Semglee 10 units BID to start this morning) and increase meal coverage to Novolog 6 units TID with meals if patient eats at least 50% of meals. ? ?HbgA1C:  A1C 11.5% on 01/10/22 indicating an average glucose of 283 mg/dl over the past 2-3 months. A1C 11.2% on 10/04/21 (noted in [facility]). ?  ?NOTE: Spoke with patient about diabetes and home regimen for diabetes control. Provided patient with FreeStyle Libre2 sensors (2) (ordered by Dr. Roxan Hockey on 01/10/22). Educated patient on FreeStyle Libre2 CGM regarding application and changing CGM sensor (alternate every 14 days on back of arms), 1 hour warm-up, how to scan sensor for glucose reading and information for PCP. Patient has been given Freestyle Libre2 sensor samples.  Provided educational packet regarding FreeStyle Libre2 CGM. Assisted patient to apply to back of upper right arm at 9:30 am.  Explained that glucose readings will not be available until 1 hour after application.  Patient plans to use iphone FreeStyle Libre2 app to read [employer] sensor. Asked patient to have PCP continue to provide Rx for FreeStyle Libre2 sensors going forward. Asked patient to be sure to let PCP know about Mike Craze and allow provider to review reports from [facility] app so the provider can use the information to continue to make adjustments with DM medications if needed. Patient would like to find PCP and Endocrinology closer to [place]. Informed patient that [facility] is [place]ocrinology [place]. Will add contact information to discharge paperwork regarding office information.  Patient reports she can afford Novolin 70/30 insulin and Metformin and she has insulin and Metformin at home. Discussed that FreeStyle Mike Craze will have copay as well but not sure of exact cost and patient states she would be able to afford the copays. Discussed taking 70/30 consistently twice a day to get DM under better control. Patient reports that she has symptoms of hypoglycemia when her glucose is within normal ranges (in 100's). Discussed that if her body is use to higher glucose levels that when glucose is in target ranges she may be having symptoms of hypoglycemia. Explained that if glucose control is improved gradually then her body will get more use to the more normal glucose range of 80-130 mg/dl without her experiencing symptoms of hypoglycemia. Also discussed copay cards that could decrease cost of copay with prescribed medications in the future. Patient verbalized understanding of information and has no questions at this time. ? ?Thanks, ?Barnie Alderman, RN, MSN, CDE ?Diabetes Coordinator ?  Inpatient Diabetes Program ?[phone] (Team Pager from 8am to 5pm) ? ? ?

## 2022-01-11 NOTE — Progress Notes (Signed)
Patient states understanding of discharge instructions.  

## 2022-01-11 NOTE — Discharge Instructions (Addendum)
If interested in changing to local Endocrinology please consider ?[facility] ?[address] [place], [place] [address] ?[phone] ? ? ? ? ? ?IMPORTANT INFORMATION: PAY CLOSE ATTENTION  ? ?PHYSICIAN DISCHARGE INSTRUCTIONS ? ?Follow with Primary care provider  [facility], [facility]  and other consultants as instructed by your Hospitalist Physician ? ?SEEK MEDICAL CARE OR RETURN TO EMERGENCY ROOM IF SYMPTOMS COME BACK, WORSEN OR NEW PROBLEM DEVELOPS  ? ?Please note: ?You were cared for by a hospitalist during your hospital stay. Every effort will be made to forward records to your primary care provider.  You can request that your primary care provider send for your hospital records if they have not received them.  Once you are discharged, your primary care physician will handle any further medical issues. Please note that NO REFILLS for any discharge medications will be authorized once you are discharged, as it is imperative that you return to your primary care physician (or establish a relationship with a primary care physician if you do not have one) for your post hospital discharge needs so that they can reassess your need for medications and monitor your lab values. ? ?Please get a complete blood count and chemistry panel checked by your Primary MD at your next visit, and again as instructed by your Primary MD. ? ?Get Medicines reviewed and adjusted: ?Please take all your medications with you for your next visit with your Primary MD ? ?Laboratory/radiological data: ?Please request your Primary MD to go over all hospital tests and procedure/radiological results at the follow up, please ask your primary care provider to get all Hospital records sent to his/her office. ? ?In some cases, they will be blood work, cultures and biopsy results pending at the time of your discharge. Please request that your primary care provider follow up on these results. ? ?If you are  diabetic, please bring your blood sugar readings with you to your follow up appointment with primary care.   ? ?Please call and make your follow up appointments as soon as possible.   ? ?Also Note the following: ?If you experience worsening of your admission symptoms, develop shortness of breath, life threatening emergency, suicidal or homicidal thoughts you must seek medical attention immediately by calling 911 or calling your MD immediately  if symptoms less severe. ? ?You must read complete instructions/literature along with all the possible adverse reactions/side effects for all the Medicines you take and that have been prescribed to you. Take any new Medicines after you have completely understood and accpet all the possible adverse reactions/side effects.  ? ?Do not drive when taking Pain medications or sleeping medications (Benzodiazepines) ? ?Do not take more than prescribed Pain, Sleep and Anxiety Medications. It is not advisable to combine anxiety,sleep and pain medications without talking with your primary care practitioner ? ?Special Instructions: If you have smoked or chewed Tobacco  in the last 2 yrs please stop smoking, stop any regular Alcohol  and or any Recreational drug use. ? ?Wear Seat belts while driving.  Do not drive if taking any narcotic, mind altering or controlled substances or recreational drugs or alcohol.  ? ? ? ? ? ?

## 2022-01-11 NOTE — Discharge Summary (Signed)
Physician Discharge Summary  ?Rita Austin RUE:[id number] DOB: Feb 03, 1971 DOA: 01/09/2022 ? ?PCP: [facility] ? ?Admit date: 01/09/2022 ?Discharge date: 01/11/2022 ? ?Admitted From:  Home  ?Disposition:  Home  ? ?Recommendations for Outpatient Follow-up:  ?Follow up with PCP in 1 weeks for recheck ?Consider follow up with endocrinologist as recommended to establish care ?Please test blood glucose often and report readings to primary care provider  ?Please monitor blood pressure carefully and frequently and adjust medications with primary care provider ? ?Discharge Condition: STABLE   ?CODE STATUS: FULL ?DIET: heart healthy/carb modified   ? ?Brief Hospitalization Summary: ?Please see all hospital notes, images, labs for full details of the hospitalization. ?ADMISSION HPI: Rita Austin is a 51 y.o. female with medical history significant of breast cancer in remission, CHF, T2DM, hypertension, hyperlipidemia who presents to the emergency department due to weakness and fatigue which has been going on for several days, she also complained of nonreproducible, nonradiating pain on right side of chest which worsens on exertion.  She complained of occasional lower extremity swelling and she takes an extra dose of Lasix when this occurs.  She denies fever, chills, headache, blurry vision, shortness of breath, numbness or tingling, nausea or vomiting. ?  ?ED Course:  ?In the emergency department, patient was hemodynamically stable.  Work-up in the ED showed normocytic anemia, BMP showed hypokalemia, hyperglycemia, BUN/creatinine 27/2.70 (creatinine done at [facility] in January 2023, showed creatinine of 1.11).  Troponin x2 - 19  > 22, D-dimer was normal, BNP was 43, magnesium 1.8, phosphorus 4.1.  Influenza a, B, SARS coronavirus 2 was negative.  Chest x-ray showed no active cardiopulmonary disease.  IV NS 500 mL was given.  Hospitalist was asked to admit.  For  further evaluation and management. ? ?HOSPITAL COURSE ?Pt was admitted for AKI from dehydration and was placed on IV fluid hydration and losartan and lasix and metformin was placed on hold.  She has had markedly elevated blood pressures and amlodipine 10 mg added and carvedilol increased to 12.5 mg BID, her A1c was markedly elevated at 11.5%.  She says she takes Relion 70/30 insulin from [facility] but not taking it consistently.  She was seen by diabetes coordinator and counseled.  She is now agreeable to taking the medication regularly.  She was supposed to be taking 20 units twice daily with meals and taking metformin as well.  She says that she will go back to taking her medications as prescribed and the diabetes coordinator made arrangements for her to get a freestyle 2 CGM device and new Rxs provided for supplies.  The diabetes coordinator informs me that patient is agreeable to paying the cost of the supplies and should be able to afford them.  Patient was counseled in detail about the importance of following up closely with PCP for blood pressure and diabetes management.  Pt is stable to discharge home today.  Pt also advised to establish care with endocrinologist and the contact information was provided for the patient.    ? ?Discharge Diagnoses:  ?Principal Problem: ?  Acute kidney injury ([place]) ?Active Problems: ?  Type 2 diabetes mellitus with hyperglycemia ([place]) ?  Hypokalemia ?  Elevated troponin ?  Prolonged QT interval ?  Chronic HFrEF (heart failure with reduced ejection fraction) ([place]) ?  Essential hypertension ?  Mixed hyperlipidemia ?  History of breast cancer ? ? ?Discharge Instructions: ? ?Allergies as of 01/11/2022   ? ?  Reactions  ? Ace Inhibitors Swelling  ? Lips swell  ? ?  ? ?  ?Medication List  ?  ? ?STOP taking these medications   ? ?HumuLIN 70/30 KwikPen (70-30) 100 UNIT/ML KwikPen ?Generic drug: insulin isophane & regular human KwikPen ?Replaced by: NovoLIN 70/30 ReliOn (70-30) 100  UNIT/ML injection ?  ?olmesartan 20 MG tablet ?Commonly known as: BENICAR ?  ? ?  ? ?TAKE these medications   ? ?amLODipine 10 MG tablet ?Commonly known as: NORVASC ?Take 1 tablet (10 mg total) by mouth daily. ?Start taking on: January 12, 2022 ?  ?atorvastatin 40 MG tablet ?Commonly known as: LIPITOR ?Take 40 mg by mouth daily. ?  ?carvedilol 12.5 MG tablet ?Commonly known as: COREG ?Take 1 tablet (12.5 mg total) by mouth 2 (two) times daily. ?What changed:  ?medication strength ?how much to take ?  ?ferrous sulfate 325 (65 FE) MG tablet ?Take 65 mg by mouth daily with breakfast. ?  ?FreeStyle Libre 2 Sensor Misc ?1 Units by Does not apply route as directed. ?  ?furosemide 40 MG tablet ?Commonly known as: LASIX ?TAKE 1 TABLET BY MOUTH ONCE DAILY AS NEEDED FOR  SWELLING ?  ?losartan 50 MG tablet ?Commonly known as: COZAAR ?Take 1 tablet (50 mg total) by mouth daily. ?  ?metFORMIN 500 MG 24 hr tablet ?Commonly known as: GLUCOPHAGE-XR ?Take 1,000 mg by mouth 2 (two) times daily. ?  ?NovoLIN 70/30 ReliOn (70-30) 100 UNIT/ML injection ?Generic drug: insulin NPH-regular Human ?Inject 20 Units into the skin 2 (two) times daily with a meal. ?Replaces: HumuLIN 70/30 KwikPen (70-30) 100 UNIT/ML KwikPen ?  ? ?  ? ? Follow-up Information   ? ? [facility]. Schedule an appointment as soon as possible for a visit in 1 week(s).   ?Why: Hospital Follow Up ?Contact information: ?[place] [address] ?[phone] ? ? ?  ?  ? ? Arnoldo Lenis, MD .   ?Specialty: Cardiology ?Contact information: ?[address] ?[address] ?[place] [address] ?[phone] ? ? ?  ?  ? ?  ?  ? ?  ? ?Allergies  ?Allergen Reactions  ? Ace Inhibitors Swelling  ?  Lips swell  ? ?Allergies as of 01/11/2022   ? ?   Reactions  ? Ace Inhibitors Swelling  ? Lips swell  ? ?  ? ?  ?Medication List  ?  ? ?STOP taking these medications   ? ?HumuLIN 70/30 KwikPen (70-30) 100 UNIT/ML KwikPen ?Generic  drug: insulin isophane & regular human KwikPen ?Replaced by: NovoLIN 70/30 ReliOn (70-30) 100 UNIT/ML injection ?  ?olmesartan 20 MG tablet ?Commonly known as: BENICAR ?  ? ?  ? ?TAKE these medications   ? ?amLODipine 10 MG tablet ?Commonly known as: NORVASC ?Take 1 tablet (10 mg total) by mouth daily. ?Start taking on: January 12, 2022 ?  ?atorvastatin 40 MG tablet ?Commonly known as: LIPITOR ?Take 40 mg by mouth daily. ?  ?carvedilol 12.5 MG tablet ?Commonly known as: COREG ?Take 1 tablet (12.5 mg total) by mouth 2 (two) times daily. ?What changed:  ?medication strength ?how much to take ?  ?ferrous sulfate 325 (65 FE) MG tablet ?Take 65 mg by mouth daily with breakfast. ?  ?FreeStyle Libre 2 Sensor Misc ?1 Units by Does not apply route as directed. ?  ?furosemide 40 MG tablet ?Commonly known as: LASIX ?TAKE 1 TABLET BY MOUTH ONCE DAILY AS NEEDED FOR  SWELLING ?  ?  losartan 50 MG tablet ?Commonly known as: COZAAR ?Take 1 tablet (50 mg total) by mouth daily. ?  ?metFORMIN 500 MG 24 hr tablet ?Commonly known as: GLUCOPHAGE-XR ?Take 1,000 mg by mouth 2 (two) times daily. ?  ?NovoLIN 70/30 ReliOn (70-30) 100 UNIT/ML injection ?Generic drug: insulin NPH-regular Human ?Inject 20 Units into the skin 2 (two) times daily with a meal. ?Replaces: HumuLIN 70/30 KwikPen (70-30) 100 UNIT/ML KwikPen ?  ? ?  ? ? ?Procedures/Studies: ?DG Chest 2 View ? ?Result Date: 01/09/2022 ?CLINICAL DATA:  Shortness of breath. EXAM: CHEST - 2 VIEW COMPARISON:  Chest x-ray 05/07/2013. FINDINGS: The heart size and mediastinal contours are within normal limits. Both lungs are clear. The visualized skeletal structures are unremarkable. Right axillary surgical clips are present. IMPRESSION: No active cardiopulmonary disease. Electronically Signed   By: Ronney Asters M.D.   On: 01/09/2022 21:41   ?  ?Subjective: ?Pt reports that she is feeling better.  She says she feels that she will be able to afford the Relion insulin at [facility] and also the  freestyle 2 CGM supplies.  She is agreeable to follow up with her PCP.   ? ?Discharge Exam: ?Vitals:  ? 01/10/22 2232 01/11/22 0725  ?BP: (!) 141/76 (!) 180/100  ?Pulse: 96 93  ?Resp:  18  ?Temp:  98.2 ?F (36.8 ?C)  ?S

## 2022-01-24 ENCOUNTER — Ambulatory Visit: Payer: Medicare Other | Admitting: Nurse Practitioner
